# Patient Record
Sex: Female | Born: 1935 | Race: White | Hispanic: No | Marital: Married | Smoking: Former smoker
Health system: Southern US, Community
[De-identification: ages and names within clinical notes are randomized; demographics above are authoritative.]

## PROBLEM LIST (undated history)

## (undated) DIAGNOSIS — G250 Essential tremor: Secondary | ICD-10-CM

## (undated) DIAGNOSIS — J449 Chronic obstructive pulmonary disease, unspecified: Secondary | ICD-10-CM

## (undated) DIAGNOSIS — I480 Paroxysmal atrial fibrillation: Secondary | ICD-10-CM

## (undated) DIAGNOSIS — I4891 Unspecified atrial fibrillation: Secondary | ICD-10-CM

## (undated) DIAGNOSIS — R251 Tremor, unspecified: Secondary | ICD-10-CM

## (undated) DIAGNOSIS — J45909 Unspecified asthma, uncomplicated: Secondary | ICD-10-CM

## (undated) DIAGNOSIS — R413 Other amnesia: Secondary | ICD-10-CM

## (undated) DIAGNOSIS — I10 Essential (primary) hypertension: Secondary | ICD-10-CM

## (undated) HISTORY — DX: Tremor, unspecified: R25.1

## (undated) HISTORY — DX: Unspecified atrial fibrillation: I48.91

## (undated) HISTORY — DX: Other amnesia: R41.3

## (undated) HISTORY — PX: BREAST BIOPSY: SHX20

## (undated) HISTORY — DX: Paroxysmal atrial fibrillation: I48.0

## (undated) HISTORY — DX: Essential tremor: G25.0

## (undated) HISTORY — PX: OTHER SURGICAL HISTORY: SHX169

## (undated) HISTORY — DX: Essential (primary) hypertension: I10

---

## 2001-02-11 ENCOUNTER — Ambulatory Visit (HOSPITAL_COMMUNITY): Admission: RE | Admit: 2001-02-11 | Discharge: 2001-02-11 | Payer: Self-pay | Admitting: Obstetrics and Gynecology

## 2001-02-11 ENCOUNTER — Encounter: Payer: Self-pay | Admitting: Obstetrics and Gynecology

## 2002-03-20 ENCOUNTER — Ambulatory Visit (HOSPITAL_COMMUNITY): Admission: RE | Admit: 2002-03-20 | Discharge: 2002-03-20 | Payer: Self-pay | Admitting: Pulmonary Disease

## 2003-04-17 ENCOUNTER — Ambulatory Visit (HOSPITAL_COMMUNITY): Admission: RE | Admit: 2003-04-17 | Discharge: 2003-04-17 | Payer: Self-pay | Admitting: Pulmonary Disease

## 2003-11-07 ENCOUNTER — Ambulatory Visit (HOSPITAL_COMMUNITY): Admission: RE | Admit: 2003-11-07 | Discharge: 2003-11-07 | Payer: Self-pay | Admitting: Pulmonary Disease

## 2004-04-30 ENCOUNTER — Ambulatory Visit (HOSPITAL_COMMUNITY): Admission: RE | Admit: 2004-04-30 | Discharge: 2004-04-30 | Payer: Self-pay | Admitting: Pulmonary Disease

## 2005-04-09 ENCOUNTER — Ambulatory Visit: Payer: Self-pay | Admitting: Internal Medicine

## 2005-05-14 ENCOUNTER — Ambulatory Visit (HOSPITAL_COMMUNITY): Admission: RE | Admit: 2005-05-14 | Discharge: 2005-05-14 | Payer: Self-pay | Admitting: Pulmonary Disease

## 2005-07-23 ENCOUNTER — Ambulatory Visit (HOSPITAL_COMMUNITY): Admission: RE | Admit: 2005-07-23 | Discharge: 2005-07-23 | Payer: Self-pay | Admitting: Pulmonary Disease

## 2005-07-24 ENCOUNTER — Ambulatory Visit (HOSPITAL_COMMUNITY): Payer: Self-pay | Admitting: Pulmonary Disease

## 2005-07-24 ENCOUNTER — Encounter (HOSPITAL_COMMUNITY): Admission: RE | Admit: 2005-07-24 | Discharge: 2005-08-23 | Payer: Self-pay | Admitting: Pulmonary Disease

## 2005-08-03 ENCOUNTER — Ambulatory Visit (HOSPITAL_COMMUNITY): Admission: RE | Admit: 2005-08-03 | Discharge: 2005-08-03 | Payer: Self-pay | Admitting: Pulmonary Disease

## 2006-03-16 ENCOUNTER — Inpatient Hospital Stay (HOSPITAL_COMMUNITY): Admission: EM | Admit: 2006-03-16 | Discharge: 2006-03-18 | Payer: Self-pay | Admitting: Emergency Medicine

## 2006-04-06 ENCOUNTER — Encounter (HOSPITAL_COMMUNITY): Admission: RE | Admit: 2006-04-06 | Discharge: 2006-05-06 | Payer: Self-pay | Admitting: Pulmonary Disease

## 2006-04-23 ENCOUNTER — Ambulatory Visit (HOSPITAL_COMMUNITY): Admission: RE | Admit: 2006-04-23 | Discharge: 2006-04-23 | Payer: Self-pay | Admitting: Pulmonary Disease

## 2006-05-14 ENCOUNTER — Encounter (HOSPITAL_COMMUNITY): Admission: RE | Admit: 2006-05-14 | Discharge: 2006-06-13 | Payer: Self-pay | Admitting: Pulmonary Disease

## 2006-05-18 ENCOUNTER — Ambulatory Visit (HOSPITAL_COMMUNITY): Admission: RE | Admit: 2006-05-18 | Discharge: 2006-05-18 | Payer: Self-pay | Admitting: Pulmonary Disease

## 2006-06-14 ENCOUNTER — Encounter (HOSPITAL_COMMUNITY): Admission: RE | Admit: 2006-06-14 | Discharge: 2006-07-14 | Payer: Self-pay | Admitting: Pulmonary Disease

## 2007-03-24 ENCOUNTER — Ambulatory Visit (HOSPITAL_COMMUNITY): Admission: RE | Admit: 2007-03-24 | Discharge: 2007-03-24 | Payer: Self-pay | Admitting: Pulmonary Disease

## 2007-04-14 ENCOUNTER — Ambulatory Visit: Payer: Self-pay | Admitting: Orthopedic Surgery

## 2007-06-29 ENCOUNTER — Ambulatory Visit (HOSPITAL_COMMUNITY): Admission: RE | Admit: 2007-06-29 | Discharge: 2007-06-29 | Payer: Self-pay | Admitting: Pulmonary Disease

## 2008-07-05 ENCOUNTER — Ambulatory Visit (HOSPITAL_COMMUNITY): Admission: RE | Admit: 2008-07-05 | Discharge: 2008-07-05 | Payer: Self-pay | Admitting: Pulmonary Disease

## 2009-05-21 ENCOUNTER — Encounter: Payer: Self-pay | Admitting: Cardiology

## 2009-05-21 LAB — CONVERTED CEMR LAB
CO2: 21 meq/L
Chloride: 105 meq/L
Glucose, Bld: 76 mg/dL

## 2009-06-13 ENCOUNTER — Ambulatory Visit: Payer: Self-pay | Admitting: Cardiology

## 2009-06-13 ENCOUNTER — Encounter: Payer: Self-pay | Admitting: Cardiology

## 2009-06-13 ENCOUNTER — Inpatient Hospital Stay (HOSPITAL_COMMUNITY): Admission: EM | Admit: 2009-06-13 | Discharge: 2009-06-15 | Payer: Self-pay | Admitting: Emergency Medicine

## 2009-06-13 LAB — CONVERTED CEMR LAB: TSH: 3.322 microintl units/mL

## 2009-07-09 DIAGNOSIS — I5042 Chronic combined systolic (congestive) and diastolic (congestive) heart failure: Secondary | ICD-10-CM

## 2009-07-09 DIAGNOSIS — E785 Hyperlipidemia, unspecified: Secondary | ICD-10-CM

## 2009-07-09 DIAGNOSIS — I10 Essential (primary) hypertension: Secondary | ICD-10-CM | POA: Insufficient documentation

## 2009-07-09 DIAGNOSIS — I498 Other specified cardiac arrhythmias: Secondary | ICD-10-CM

## 2009-07-09 DIAGNOSIS — J4489 Other specified chronic obstructive pulmonary disease: Secondary | ICD-10-CM | POA: Insufficient documentation

## 2009-07-09 DIAGNOSIS — J449 Chronic obstructive pulmonary disease, unspecified: Secondary | ICD-10-CM

## 2009-07-10 ENCOUNTER — Ambulatory Visit: Payer: Self-pay | Admitting: Cardiology

## 2009-07-17 ENCOUNTER — Ambulatory Visit (HOSPITAL_COMMUNITY): Admission: RE | Admit: 2009-07-17 | Discharge: 2009-07-17 | Payer: Self-pay | Admitting: Pulmonary Disease

## 2009-07-18 ENCOUNTER — Encounter (INDEPENDENT_AMBULATORY_CARE_PROVIDER_SITE_OTHER): Payer: Self-pay

## 2009-07-18 LAB — CONVERTED CEMR LAB
ALT: 35 units/L
Albumin: 4.9 g/dL
Alkaline Phosphatase: 80 units/L
BUN: 14 mg/dL
CO2: 22 meq/L
Creatinine, Ser: 0.79 mg/dL
Glucose, Bld: 125 mg/dL
Total Protein: 7.1 g/dL

## 2009-09-09 ENCOUNTER — Encounter: Payer: Self-pay | Admitting: Adult Health

## 2009-09-09 ENCOUNTER — Ambulatory Visit: Payer: Self-pay | Admitting: Cardiology

## 2009-09-09 DIAGNOSIS — R002 Palpitations: Secondary | ICD-10-CM

## 2009-09-17 ENCOUNTER — Ambulatory Visit: Payer: Self-pay | Admitting: Cardiology

## 2009-10-23 ENCOUNTER — Ambulatory Visit: Payer: Self-pay | Admitting: Cardiology

## 2009-10-23 ENCOUNTER — Encounter (INDEPENDENT_AMBULATORY_CARE_PROVIDER_SITE_OTHER): Payer: Self-pay

## 2009-10-30 ENCOUNTER — Telehealth (INDEPENDENT_AMBULATORY_CARE_PROVIDER_SITE_OTHER): Payer: Self-pay | Admitting: *Deleted

## 2009-11-06 ENCOUNTER — Encounter: Payer: Self-pay | Admitting: Cardiology

## 2010-07-18 ENCOUNTER — Ambulatory Visit (HOSPITAL_COMMUNITY): Admission: RE | Admit: 2010-07-18 | Discharge: 2010-07-18 | Payer: Self-pay | Admitting: Pulmonary Disease

## 2010-11-18 NOTE — Progress Notes (Signed)
Summary: RX REFILL  Phone Note Call from Patient Call back at Home Phone [phone]   Caller: PT Reason for Call: Refill Medication Summary of Call: PT IS CALLING BECAUSE RX FOR DILTIAZEM WAS [facility]ED [facility] BUT THEY CALLED PATIENT TO LET HER KNOW IT NEEDED CALLED IN AGAIN WAS MISSING SOME INFORMATION. Initial call taken by: Faythe Ghee,  October 30, 2009 10:42 AM  Follow-up for Phone Call        [facility] Follow-up by: Teressa Lower RN,  October 31, 2009 11:40 AM

## 2010-11-18 NOTE — Miscellaneous (Signed)
Summary: CMP, Lipid's and TSH -07-18-09  Clinical Lists Changes  Observations: Added new observation of CALCIUM: 10.1 mg/dL (56/21/3086 57:84) Added new observation of ALBUMIN: 4.9 g/dL (69/62/9528 41:32) Added new observation of PROTEIN, TOT: 7.1 g/dL (44/10/270 53:66) Added new observation of SGPT (ALT): 35 units/L (07/18/2009 10:04) Added new observation of SGOT (AST): 30 units/L (07/18/2009 10:04) Added new observation of ALK PHOS: 80 units/L (07/18/2009 10:04) Added new observation of BILI DIRECT: Bili total: 0.3 mg/dL (44/12/4740 59:56) Added new observation of CREATININE: 0.79 mg/dL (38/75/6433 29:51) Added new observation of BUN: 14 mg/dL (88/41/6606 30:16) Added new observation of BG RANDOM: 125 mg/dL (10/27/3233 57:32) Added new observation of CO2 PLSM/SER: 22 meq/L (07/18/2009 10:04) Added new observation of CL SERUM: 107 meq/L (07/18/2009 10:04) Added new observation of K SERUM: 4.6 meq/L (07/18/2009 10:04) Added new observation of NA: 147 meq/L (07/18/2009 10:04) Added new observation of LDL: not calc mg/dL (20/25/4270 62:37) Added new observation of HDL: 59 mg/dL (62/83/1517 61:60) Added new observation of TRIGLYC TOT: 523 mg/dL (73/71/0626 94:85) Added new observation of CHOLESTEROL: 276 mg/dL (46/27/0350 09:38) Added new observation of TSH: 4.264 microintl units/mL (07/18/2009 10:04)

## 2010-11-18 NOTE — Assessment & Plan Note (Signed)
Summary: ROV   Visit Type:  Follow-up Primary Provider:  Dr.Edward Juanetta Gosling   History of Present Illness: [name] returns today for evaluation and management of her paroxysmal atrial fibrillation. On her last visit, her diltiazem was increased which has made her feel remarkably better with no further palpitations.  She denies orthopnea, PND, peripheral edema. She does not want to take Coumadin as outlined in previous notes.  Current Medications (verified): 1)  Singulair 10 Mg Tabs (Montelukast Sodium) .... Take 1 Tablet By Mouth Once A Day 2)  Symbicort 160-4.5 Mcg/act Aero (Budesonide-Formoterol Fumarate) .... Takes 1 Puff Two Times A Day 3)  Xopenex Concentrate 1.25 Mg/0.22ml Nebu (Levalbuterol Hcl) .... Take As Directed 4)  Xyzal 5 Mg Tabs (Levocetirizine Dihydrochloride) .... Take 1 Tab Daily 5)  Crestor 10 Mg Tabs (Rosuvastatin Calcium) .... Take 1 Tab Daily 6)  Zolpidem Tartrate 5 Mg Tabs (Zolpidem Tartrate) .... Take 1/2 Tab Qhs 7)  Fish Oil 1000 Mg Caps (Omega-3 Fatty Acids) .... Take 1 Cap Daily 8)  Diltiazem Hcl Er Beads 240 Mg Xr24h-Cap (Diltiazem Hcl Er Beads) .... Take 1 Tablet By Mouth Once Daily 9)  Benazepril-Hydrochlorothiazide 10-12.5 Mg Tabs (Benazepril-Hydrochlorothiazide) .... Take 1 Tab Daily 10)  Aspirin 81 Mg Tbec (Aspirin) .... Take One Tablet By Mouth Daily 11)  Womens Multivitamin Plus  Tabs (Multiple Vitamins-Minerals) .... Take 1 Tablet By Mouth Once A Day  Allergies (verified): 1)  ! Penicillin 2)  ! Codeine  Past History:  Past Medical History: Last updated: 07/09/2009 Current Problems:  UNSPEC COMBINED SYSTOLIC&DIASTOLIC HEART FAILURE (ICD-428.40) HYPERLIPIDEMIA, CONTROLLED (ICD-272.4) HYPERTENSION (ICD-401.9) COPD (ICD-496) ATRIAL ARRHYTHMIAS (ICD-427.89)  Past Surgical History: Last updated: 07/09/2009 no surgiacl history   Family History: Last updated: 07/09/2009 Father: Mother: Siblings:  Social History: Last updated:  07/09/2009 Retired  Married  Tobacco Use - Former.  Alcohol Use - no Regular Exercise - no Drug Use - no  Risk Factors: Exercise: no (07/09/2009)  Risk Factors: Smoking Status: quit (07/09/2009)  Review of Systems       negative other than history of present illness  Vital Signs:  Patient profile:   77 year old female Height:      63 inches Weight:      161 pounds O2 Sat:      96 % on Room air Pulse rate:   75 / minute BP sitting:   124 / 65  (left arm)  Vitals Entered By: Dreama Saa, CNA (October 23, 2009 12:56 PM)  O2 Flow:  Room air  Physical Exam  General:  Well developed, well nourished, in no acute distress. Head:  normocephalic and atraumatic Eyes:  PERRLA/EOM intact; conjunctiva and lids normal. Mouth:  Teeth, gums and palate normal. Oral mucosa normal. Neck:  Neck supple, no JVD. No masses, thyromegaly or abnormal cervical nodes. Lungs:  decreased breath sounds throughout Heart:  regular rate and rhythm, normal S1-S2 Msk:  Back normal, normal gait. Muscle strength and tone normal. Pulses:  pulses normal in all 4 extremities Extremities:  No clubbing or cyanosis. Neurologic:  resting tremor in both hands, alert oriented x3 Skin:  Intact without lesions or rashes. Psych:  Normal affect.   Impression & Recommendations:  Problem # 1:  ATRIAL ARRHYTHMIAS (ICD-427.89) Assessment Improved This was mostly atrial fibrillation when she was in the hospital. Increasing diltiazem has really helped alleviate her symptoms of palpitations et Karie Soda. She does not want to take Coumadin so remained with aspirin. She promises to let us know if  she has recurrent symptoms that are more than brief period Her updated medication list for this problem includes:    Diltiazem Hcl Er Beads 240 Mg Xr24h-cap (Diltiazem hcl er beads) .Marland Kitchen... Take 1 tablet by mouth once daily    Benazepril-hydrochlorothiazide 10-12.5 Mg Tabs (Benazepril-hydrochlorothiazide) .Marland Kitchen... Take 1 tab daily     Aspirin 81 Mg Tbec (Aspirin) .Marland Kitchen... Take one tablet by mouth daily  Her updated medication list for this problem includes:    Diltiazem Hcl Er Beads 240 Mg Xr24h-cap (Diltiazem hcl er beads) .Marland Kitchen... Take 1 tablet by mouth once daily    Benazepril-hydrochlorothiazide 10-12.5 Mg Tabs (Benazepril-hydrochlorothiazide) .Marland Kitchen... Take 1 tab daily    Aspirin 81 Mg Tbec (Aspirin) .Marland Kitchen... Take one tablet by mouth daily  Problem # 2:  PALPITATIONS (ICD-785.1) Assessment: Improved  Her updated medication list for this problem includes:    Diltiazem Hcl Er Beads 240 Mg Xr24h-cap (Diltiazem hcl er beads) .Marland Kitchen... Take 1 tablet by mouth once daily    Benazepril-hydrochlorothiazide 10-12.5 Mg Tabs (Benazepril-hydrochlorothiazide) .Marland Kitchen... Take 1 tab daily    Aspirin 81 Mg Tbec (Aspirin) .Marland Kitchen... Take one tablet by mouth daily  Her updated medication list for this problem includes:    Diltiazem Hcl Er Beads 240 Mg Xr24h-cap (Diltiazem hcl er beads) .Marland Kitchen... Take 1 tablet by mouth once daily    Benazepril-hydrochlorothiazide 10-12.5 Mg Tabs (Benazepril-hydrochlorothiazide) .Marland Kitchen... Take 1 tab daily    Aspirin 81 Mg Tbec (Aspirin) .Marland Kitchen... Take one tablet by mouth daily  Problem # 3:  HYPERTENSION (ICD-401.9) Assessment: Improved  Her updated medication list for this problem includes:    Diltiazem Hcl Er Beads 240 Mg Xr24h-cap (Diltiazem hcl er beads) .Marland Kitchen... Take 1 tablet by mouth once daily    Benazepril-hydrochlorothiazide 10-12.5 Mg Tabs (Benazepril-hydrochlorothiazide) .Marland Kitchen... Take 1 tab daily    Aspirin 81 Mg Tbec (Aspirin) .Marland Kitchen... Take one tablet by mouth daily  Her updated medication list for this problem includes:    Diltiazem Hcl Er Beads 240 Mg Xr24h-cap (Diltiazem hcl er beads) .Marland Kitchen... Take 1 tablet by mouth once daily    Benazepril-hydrochlorothiazide 10-12.5 Mg Tabs (Benazepril-hydrochlorothiazide) .Marland Kitchen... Take 1 tab daily    Aspirin 81 Mg Tbec (Aspirin) .Marland Kitchen... Take one tablet by mouth daily  Patient Instructions: 1)  Your  physician recommends that you schedule a follow-up appointment in: 1 year 2)  Your physician recommends that you continue on your current medications as directed. Please refer to the Current Medication list given to you today. Prescriptions: DILTIAZEM HCL ER BEADS 240 MG XR24H-CAP (DILTIAZEM HCL ER BEADS) Take 1 tablet by mouth once daily  #90 x 3   Entered by:   Larita Fife Via LPN   Authorized by:   Gaylord Shih, MD, [facility]   Signed by:   Larita Fife Via LPN on 16/07/9603   Method used:   Faxed to ...       [facility] education [facility])       [address] [address]       [place], [place]  [address]       Ph: [phone]       Fax: [phone]   RxID:   [id number]

## 2010-11-18 NOTE — Miscellaneous (Signed)
Summary: diltiazem refill  Clinical Lists Changes  Medications: Rx of DILTIAZEM HCL ER BEADS 240 MG XR24H-CAP (DILTIAZEM HCL ER BEADS) Take 1 tablet by mouth once daily;  #90 x 3;  Signed;  Entered by: Teressa Lower RN;  Authorized by: Gaylord Shih, MD, [facility];  Method used: Print then Give to Patient    Prescriptions: DILTIAZEM HCL ER BEADS 240 MG XR24H-CAP (DILTIAZEM HCL ER BEADS) Take 1 tablet by mouth once daily  #90 x 3   Entered by:   Teressa Lower RN   Authorized by:   Gaylord Shih, MD, [facility]   Signed by:   Teressa Lower RN on 11/06/2009   Method used:   Print then Give to Patient   RxID:   [id number]

## 2010-12-17 ENCOUNTER — Encounter: Payer: Self-pay | Admitting: *Deleted

## 2011-01-07 ENCOUNTER — Encounter: Payer: Self-pay | Admitting: Cardiology

## 2011-01-07 ENCOUNTER — Ambulatory Visit (INDEPENDENT_AMBULATORY_CARE_PROVIDER_SITE_OTHER): Payer: Medicare Other | Admitting: Cardiology

## 2011-01-07 VITALS — BP 107/59 | HR 66 | Ht 63.0 in | Wt 140.0 lb

## 2011-01-07 DIAGNOSIS — R002 Palpitations: Secondary | ICD-10-CM

## 2011-01-07 DIAGNOSIS — I4891 Unspecified atrial fibrillation: Secondary | ICD-10-CM

## 2011-01-07 DIAGNOSIS — I480 Paroxysmal atrial fibrillation: Secondary | ICD-10-CM

## 2011-01-07 NOTE — Progress Notes (Signed)
   Patient ID: [name], female    DOB: 1934-05-06, 77 y.o.   MRN: [id number]  HPI [name] comes in for evaluation and management of her PAF and history of palpitations. The only time she has palpitations is when she is upset. She remains an active walker and has no limitations. She still does want to take Coumadin. She has lost 30lbs. E.KG today shows NSR.    Review of Systems  All other systems reviewed and are negative.      Physical Exam  Constitutional: She is oriented to person, place, and time. She appears well-developed and well-nourished.  HENT:  Head: Normocephalic and atraumatic.  Eyes: Conjunctivae and EOM are normal. Pupils are equal, round, and reactive to light.  Neck: Normal range of motion. Neck supple. No tracheal deviation present. No thyromegaly present.  Cardiovascular: Normal rate, regular rhythm and intact distal pulses.   Murmur heard. Pulmonary/Chest: Effort normal and breath sounds normal.  Abdominal: Soft. Bowel sounds are normal.  Musculoskeletal: Normal range of motion. She exhibits no edema and no tenderness.  Neurological: She is alert and oriented to person, place, and time. She has normal reflexes.  Skin: Skin is warm and dry.  Psychiatric: She has a normal mood and affect. Her behavior is normal. Judgment and thought content normal.

## 2011-01-13 ENCOUNTER — Encounter: Payer: Self-pay | Admitting: Cardiology

## 2011-01-24 LAB — BASIC METABOLIC PANEL
BUN: 9 mg/dL (ref 6–23)
Calcium: 9.3 mg/dL (ref 8.4–10.5)
Chloride: 112 mEq/L (ref 96–112)
Creatinine, Ser: 0.8 mg/dL (ref 0.4–1.2)
GFR calc Af Amer: >60 mL/min (ref >60)
Glucose, Bld: 86 mg/dL (ref 70–99)
Sodium: 137 mEq/L (ref 135–145)

## 2011-01-24 LAB — HEPATIC FUNCTION PANEL
ALT: 20 U/L (ref 0–35)
Indirect Bilirubin: 0.4 mg/dL (ref 0.3–0.9)
Total Bilirubin: 0.5 mg/dL (ref 0.3–1.2)

## 2011-01-24 LAB — CBC
HCT: 42 % (ref 36.0–46.0)
Platelets: 259 10*3/uL (ref 150–400)
RDW: 13.4 % (ref 11.5–15.5)

## 2011-01-24 LAB — DIFFERENTIAL
Basophils Relative: 0 % (ref 0–1)
Eosinophils Absolute: 0.3 10*3/uL (ref 0.0–0.7)
Monocytes Absolute: 0.8 10*3/uL (ref 0.1–1.0)
Monocytes Relative: 12 % (ref 3–12)
Neutro Abs: 4.3 10*3/uL (ref 1.7–7.7)
Neutrophils Relative %: 61 % (ref 43–77)

## 2011-01-24 LAB — CARDIAC PANEL(CRET KIN+CKTOT+MB+TROPI)
CK, MB: 3.1 ng/mL (ref 0.3–4.0)
Relative Index: 1.9 (ref 0.0–2.5)
Relative Index: 2 (ref 0.0–2.5)
Troponin I: 0.01 ng/mL (ref 0.00–0.06)
Troponin I: 0.03 ng/mL (ref 0.00–0.06)

## 2011-01-24 LAB — POCT CARDIAC MARKERS: Myoglobin, poc: 85.2 ng/mL (ref 12–200)

## 2011-01-24 LAB — PROTIME-INR: INR: 0.9 (ref 0.00–1.49)

## 2011-03-03 NOTE — Assessment & Plan Note (Signed)
[facility]                       [place] CARDIOLOGY OFFICE NOTE   NAME:FERGUSONAmera, [name]                     MRN:          [id number]  DATE:07/10/2009                            DOB:          Sep 24, 1934    [name] comes in today for followup after being hospitalized with  atrial fibrillation with rapid ventricular rate.  Some of the notes in  the hospital characterized as an SVT but I remember looking at the  strips and it look mostly like atrial fib.   Her risk factors for atrial fib include age as well as COPD.   She had had some palpitations which she complained to Dr. Juanetta Gosling about  several days prior to coming into the hospital.  He had placed a monitor  which demonstrated those findings.   Her echocardiogram in the hospital shows some diastolic dysfunction,  otherwise unremarkable.   She has had no further atrial fib since discharge.  She did have some  post conversion fatigue.   Her current meds:  1. Singulair 4 mg pack 1 daily.  2. Symbicort 160/4.5.  3. Aerosol 1 puff twice a day.  4. Xopenex 1.25 mg solution nebulizer as directed.  5. Xyzal 5 mg per day.  6. Crestor 10 mg per day.  7. Azulfidine half a tablet nightly.  8. Fish oil 1000 mg daily.  9. Diltiazem extended release 180 mg per day.  10.Benazepril HCTZ 10/12-1/2 daily.  11.Aspirin 81 mg a day.   She also has a history of hypertension as I did note in the above  notation.   She looks remarkably good today.  She has somewhat of a raspy voice.   PHYSICAL EXAMINATION:  VITAL SIGNS:  Her blood pressure is 135/79, her  pulse is 79 and regular.  She is 63 inches, weighs 168 pounds.  BMI is  29.8.  No acute distress.  HEENT:  Essentially normal.  NECK:  Supple.  Carotid upstrokes are equal bilaterally without bruits.  No JVD.  Thyroid is not enlarged.  Trachea is midline.  LUNGS:  Clear without rhonchi or wheezes, but decreased breath sounds  throughout.  CARDIAC:   Nondisplaced PMI, normal S1 and S2.  No gallop, rub, or  murmur.  ABDOMEN:  Soft, good bowel sounds, no obvious tenderness, or  organomegaly.  EXTREMITIES:  No edema.  Pulses are present.  NEURO:  Intact.   Electrocardiogram today shows sinus rhythm with nonspecific T-wave  changes otherwise normal.  Specifically, PR, QRS, QTC intervals are  normal.   ASSESSMENT AND PLAN:  [name] is doing well.  Her risk factors for  recurrent atrial fib include age, COPD particularly if there is an  exacerbations, as I explained to her and her husband today, and history  of hypertension.  She does have some minimal diastolic dysfunction on  her echocardiogram.  At this time, we will continue with aspirin and  diltiazem.  If she begins to have recurrent episodes of atrial fib,  which were characterized by palpitations and feeling extremely weak on  the morning of admission, she will let us know.  If she stays  in atrial  fib for more than 24 hours, she will need to be evaluated.  Hopefully,  we can avoid Coumadin which she really does not want to take.     Thomas C. Daleen Squibb, MD, [facility]  Electronically Signed    TCW/MedQ  DD: 07/10/2009  DT: 07/11/2009  Job #: [id number]

## 2011-03-03 NOTE — Group Therapy Note (Signed)
NAMECOLLENE, MASSIMINO              ACCOUNT NO.:  [id number]   MEDICAL RECORD NO.:  [id number]          PATIENT TYPE:  INP   LOCATION:  IC03                          FACILITY:  [facility]   PHYSICIAN:  Edward L. Juanetta Gosling, M.D.DATE OF BIRTH:  02/06/1934   DATE OF PROCEDURE:  DATE OF DISCHARGE:                                 PROGRESS NOTE   [name] was admitted yesterday with rapid heart rate.  It was SVT,  some atrial fibrillation.  She is now in sinus rhythm with a rate of  about 60 on diltiazem at 5 mg.  She says that everything else is going  okay.  She has no other new complaints.   PHYSICAL EXAMINATION:  This morning on shows she is awake and alert.  Chest is clear.  Heart is regular rate of about 60, blood pressure  110/70 and she looks very comfortable.   Cardiology consult is noted and appreciated.   LABORATORY WORK:  This morning, her electrolytes were normal and her TSH  was also normal.  Echocardiogram done yesterday shows an ejection  fraction of 60-65% with normal wall motion and some diastolic  dysfunction.  Right ventricle was normal and overall this is okay.   ASSESSMENT:  She is better.   PLAN:  To give her oral Cardizem and then depending on how she does, she  may be able to be discharged either later today or in the morning.  We  probably want to see how she does on oral medications for a 24-hour  period.      Edward L. Juanetta Gosling, M.D.  Electronically Signed     ELH/MEDQ  D:  06/14/2009  T:  06/14/2009  Job:  [id number]

## 2011-03-03 NOTE — Consult Note (Signed)
[name], [name]              ACCOUNT NO.:  [id number]   MEDICAL RECORD NO.:  [id number]          PATIENT TYPE:  INP   LOCATION:  IC03                          [facility]  [facility]   PHYSICIAN:  Thomas C. Wall, MD, FACCDATE OF BIRTH:  May 18, 1934   DATE OF CONSULTATION:  06/13/2009  DATE OF DISCHARGE:                                 CONSULTATION   PRIMARY CARDIOLOGIST:  Maisie Fus C. Daleen Squibb, MD, [facility]   PRIMARY CARE PHYSICIAN:  Edward L. Juanetta Gosling, MD   REASON FOR CONSULTATION:  Rapid SVT per CardioNet report.   HISTORY OF PRESENT ILLNESS:  A 77 year old Caucasian female with no  prior history of coronary disease but had a history of COPD,  hypertension, and hypercholesterolemia, had recently been seen by her  primary care physician, Dr. Juanetta Gosling, on May 21, 2009, secondary to  fluttering in her chest, which she mentioned during a routine office  visit.  As a result of this, she was referred to our office to have a  CardioNet placed.  CardioNet was read this morning by Dr. Nona Dell in the office and it was noted that the patient was having  episodes of SVT and atrial fibrillation with rate up to 120-140 beats  per minute.  As a result of this, the patient's primary care physician  was called by our office and she was advised to come to the emergency  room.  Upon arrival in the emergency room, the patient's heart rate was  160 beats per minute.  She denied any chest pain or shortness of breath.  She had complained of some mild dizziness associated with the rapid  heart rate.  She states that she had not been feeling the rapid heart  rate prior to 2-3 weeks ago and never had had a cardiac workup before or  any other cardiac etiology.  As a result, the patient was admitted to  ICU, was placed on a Cardizem drip of 5 mg after a 20 mg bolus and has  currently converted to normal sinus rhythm approximately 1 hour after  admission.  The patient's heart rate is now in the 60s.   REVIEW OF  SYSTEMS:  Positive for palpitations and dizziness, negative  for chest pain and shortness of breath although she does have chronic  asthma and wheezing symptoms but has not had to use her inhaler.  All  other systems have been reviewed and found to be negative other than  those mentioned above.   PAST MEDICAL HISTORY:  Right lower lobe pneumonia, recognition in 2007;  history of COPD and asthma for approximately 10 years; Legionnaires  disease in 2010; hypercholesterolemia for 2-3 years; and hypertension  for 5-6 years.   PAST SURGICAL HISTORY:  Left breast biopsy, which was found to be  negative.   SOCIAL HISTORY:  Lives in [place] with her husband who is retired  from [employer].  She is a 30-pack-year smoker who smoked for 20  years, negative for EtOH, negative for drug use.   FAMILY HISTORY:  Asthma and diabetes.   CURRENT MEDICATIONS:  1. Aspirin 81 mg daily.  2. Cardizem 5 mg drip.  3. Benazepril 10/12.5 mg daily.  4. Singulair 5 mg at bedtime.  5. Crestor 10 mg at bedtime.  6. Zolpidem 5 mg at bedtime p.r.n.  7. Low-molecular-weight heparin.   ALLERGIES:  PENICILLIN, CODEINE, and sometimes PAINT.   CURRENT LABORATORY DATA:  Hemoglobin 14.4, hematocrit 42.0, white blood  cells 7.1, and platelets 259.  Sodium 137, potassium 4.1, chloride 105,  CO2 of 26, BUN 16, creatinine 0.6, glucose 116, total bili 0.5, alkaline  phosphatase 83, AST 28, ALT 20, total protein 6.6, albumin 4.0, initial  troponin point of care 0.05, PT 4.1, and INR 0.9.  A chest x-ray  revealing COPD with no acute findings.  EKG revealing SVT at a rate of  160 beats per minute.  Cardiovascular risk factors; hypertension,  cholesterol, and age.   PHYSICAL EXAMINATION:  VITAL SIGNS:  Blood pressure 123/40, heart rate  70, respirations 18, and O2 sat 100% on 2 L.  GENERAL:  She is awake, alert, and oriented; in no acute distress.  Affect is pleasant and she responds appropriately.  HEENT:  Head  is normocephalic and atraumatic.  Eyes, PERRLA.  The  patient wears glasses.  Mucous membranes of mouth are pink and moist.  NECK:  Supple without JVD or carotid bruits appreciated.  CARDIOVASCULAR:  Regular rate and rhythm with a soft systolic murmur  noted at the left sternal border.  LUNGS:  Clear to auscultation with mild wheezes bilaterally in the  bases.  SKIN:  Warm and dry.  ABDOMEN:  Soft and nontender with 2+ bowel sounds.  EXTREMITIES:  Without clubbing, cyanosis, or edema.  NEURO:  Cranial nerves II through XII are grossly intact.   IMPRESSION:  1. Atypical rapid ventricular response and supraventricular      tachycardia, now converted to normal sinus rhythm on Cardizem drip      at 5 mg.  We will check echocardiogram.  We will take the p.o.      Cardizem in a.m. and consider outpatient stress Myoview.  We will      await TSH result.  Her [place] score currently is 1.  2. Chronic obstructive pulmonary disease with asthma per primary,      continue her medications; inhalers at his discretion.  3. Hypertension, currently controlled on current medications.   PLAN:  This is a 77 year old Caucasian female with no prior cardiac  history who is admitted secondary to abnormal CardioNet monitoring  report revealing SVT and atrial fibrillation with RVR.  The patient is  currently in normal sinus rhythm on Cardizem drip at 5 mg.  The  patient's [place] score is 1.  We will do an echocardiogram for LV  function.  Continue to cycle enzymes.  Consider outpatient stress  Myoview or cath depending on the results of cardiac enzymes and echo  report.  At this time, the patient is stable.  We will make further  recommendations throughout hospital course depending on the patient's  response.   On behalf of the physicians and providers of [facility], we would like to thank Dr. Juanetta Gosling for allowing [place] to  participate in the care of this patient.      Bettey Mare. Lyman Bishop,  NP      Jesse Sans. Daleen Squibb, MD, [facility]  Electronically Signed    KML/MEDQ  D:  06/13/2009  T:  06/13/2009  Job:  [id number]   cc:   Ramon Dredge L. Juanetta Gosling, M.D.  Fax: [phone]

## 2011-03-03 NOTE — Discharge Summary (Signed)
NAMEBERDELLA, Bowman              ACCOUNT NO.:  [id number]   MEDICAL RECORD NO.:  [id number]          PATIENT TYPE:  INP   LOCATION:  A335                          FACILITY:  [facility]   PHYSICIAN:  Edward L. Juanetta Gosling, M.D.DATE OF BIRTH:  1934-05-11   DATE OF ADMISSION:  06/13/2009  DATE OF DISCHARGE:  08/28/2010LH                               DISCHARGE SUMMARY   FINAL DISCHARGE DIAGNOSES:  1. Atrial arrhythmias with rapid ventricular response.  2. Chronic obstructive pulmonary disease.  3. Hypertension.  4. Hyperlipidemia.  5. Diastolic cardiac dysfunction.   HISTORY:  [name] is a 77 year old who came to the hospital because  of tachycardia.  She had been in her usual state of fair health at home  with some significant problems with COPD.  She had been in my office  several weeks ago complaining that she was having palpitations.  She was  set up to have an event monitor and while her monitor was on, she had a  heart rate up to about 190.  She was sent to the emergency room at that  point.  When she came to the emergency room, she was dizzy but did not  have any chest pain or any other symptoms.   PHYSICAL EXAMINATION:  GENERAL:  She is awake and alert.  VITAL SIGNS:  Heart rate was in the 190s initially then down to about  80, blood pressure was in the 120s.  CHEST:  Clear without wheezes.  HEART:  Regular without gallop.  ABDOMEN:  Soft.   LABORATORY WORK:  Essentially normal.  TSH was normal.  She had an  echocardiogram that showed some diastolic dysfunction.  She was started  on Cardizem intravenously and had excellent control.  She was placed  then on oral Cardizem and still had excellent control.  By the time of  discharge she was much improved.  She is going to be on Cardizem CD 180  daily.  She will continue with her other medications.  She will stop  benazepril and hydrochlorothiazide 10/25 but continue;  1. Singulair 5 mg daily.  2. Symbicort 164.5 two times a  day.  3. Xopenex inhaler as needed.  4. Xyzal 5 mg daily.  5. Crestor 10 mg daily.  6. Zolpidem, she takes one half of a 5 mg tablet.   She will have Cardiology followup and follow up in my office in about 10  days.  She will be on Cardizem CD 180.      Edward L. Juanetta Gosling, M.D.  Electronically Signed     ELH/MEDQ  D:  06/15/2009  T:  06/15/2009  Job:  [id number]

## 2011-03-03 NOTE — H&P (Signed)
Bowman, Donna              ACCOUNT NO.:  [id number]   MEDICAL RECORD NO.:  [id number]          PATIENT TYPE:  INP   LOCATION:  IC03                          FACILITY:  [facility]   PHYSICIAN:  Edward L. Juanetta Gosling, M.D.DATE OF BIRTH:  [birth year]   DATE OF ADMISSION:  06/13/2009  DATE OF DISCHARGE:  LH                              HISTORY & PHYSICAL   HISTORY OF PRESENT ILLNESS:  [name] is a 77 year old who was  admitted to the hospital because of tachycardia.  She had been in her  usual state of fair health at home.  She does have significant COPD and  she had been in my office several weeks ago complaining that she was  having episodes of palpitations.  She was set up with an event monitor  and when she was having the event monitor, her heart rate went up to  about 190 and the monitoring company called my office and we had her  sent to the emergency room because of the rapid heartbeat.  She was not  terribly symptomatic, but did have some dizziness.  She denied any chest  pain.  She has a history of COPD and asthma.  She has hyperlipidemia and  hypertension.   SOCIAL HISTORY:  She lives at home with her husband.  She does not use  any alcohol.  She does not use any illicit drugs.  She is a nonsmoker  for about 20+ years, but does have some smoking history.   FAMILY HISTORY:  Not positive for any sort of cardiac disease that she  is aware of, but it is positive for asthma and diabetes.   REVIEW OF SYSTEMS:  Other than as mentioned is essentially negative.   PHYSICAL EXAMINATION:  VITAL SIGNS:  Her exam when she came to the  emergency room is that her pulse rate was at the 190 sinus tach.  Blood  pressure in the 120s.  Her respirations 16.  She is afebrile.  HEENT:  Pupils are reactive.  Nose and throat are clear.  Mucous  membranes are moist.  NECK:  Supple without masses.  She does not have any bruits or jugular  venous distention.  CHEST:  Some rhonchi bilaterally, but  not a great deal.  HEART:  Regular without gallop.  ABDOMEN:  Soft without masses.  EXTREMITIES:  No edema.   LABORATORY DATA:  Lab work so far is normal.  She has a TSH pending.  She will need an echocardiogram and I have asked for cardiology  consultation.      Edward L. Juanetta Gosling, M.D.  Electronically Signed     ELH/MEDQ  D:  06/13/2009  T:  06/14/2009  Job:  [id number]

## 2011-03-03 NOTE — Group Therapy Note (Signed)
NAMEDESIRAY, ORCHARD              ACCOUNT NO.:  [id number]   MEDICAL RECORD NO.:  [id number]          PATIENT TYPE:  INP   LOCATION:  A335                          FACILITY:  [facility]   PHYSICIAN:  Edward L. Juanetta Gosling, M.D.DATE OF BIRTH:  20-Aug-1934   DATE OF PROCEDURE:  DATE OF DISCHARGE:                                 PROGRESS NOTE   Ms. Kegel is overall I think, doing much better.  She has no new  complaints.  She has not had any rapid heart beats.  She wants to be  discharged and I think that is appropriate.   PHYSICAL EXAMINATION:  VITAL SIGNS:  This morning shows that her  temperature is 97.8, pulse 82, respirations 20, blood pressure 141/75,  and her O2 sat is 98% on room air.  GENERAL:  She is in sinus rhythm on monitor.  CHEST:  Clear.  HEART:  Regular.  ABDOMEN:  Soft.   ASSESSMENT:  She is doing better.   PLAN:  To discharge.  Please see discharge summary for details.      Edward L. Juanetta Gosling, M.D.  Electronically Signed     ELH/MEDQ  D:  06/15/2009  T:  06/15/2009  Job:  [id number]

## 2011-03-06 NOTE — Discharge Summary (Signed)
NAMEMONNIE, GUDGEL              ACCOUNT NO.:  [id number]   MEDICAL RECORD NO.:  [id number]          PATIENT TYPE:  INP   LOCATION:  A310                          FACILITY:  [facility]   PHYSICIAN:  Edward L. Juanetta Gosling, M.D.DATE OF BIRTH:  June 24, 1934   DATE OF ADMISSION:  03/16/2006  DATE OF DISCHARGE:  05/31/2007LH                                 DISCHARGE SUMMARY   DISCHARGE DIAGNOSES:  1.  Right lower lobe pneumonia.  2.  Chronic obstructive pulmonary disease.  3.  Asthma.   HISTORY OF PRESENT ILLNESS:  Ms. Ishii is a 77 year old who presented  with fever, cough, congestion and chills.  This had been started about 3  hours prior to her admission.  She had been feeling great the day before and  when she came to the emergency room, she was noted to have a pneumonia by  chest x-ray.  She had some back pain.  O2 saturation was low.  She has had  COPD and asthma and previous pneumonia that had been treated as an  outpatient.   PHYSICAL EXAMINATION:  VITAL SIGNS:  Temperature 101.3, pulse 122,  respirations 22, O2 was 91% on room air.  GENERAL:  Her pain initially was rated 9/10 on her back.  CHEST:  Her chest showed rhonchi on the right, more than on the left.   The rest of her exam was pretty much unremarkable.   LABORATORY DATA AND X-RAY FINDINGS:  White blood count was 13,200, platelets  298, 92% neutrophils.  ABG with pO2 was 59, pCO2 32, pH 7.48, this on room  air.   Chest x-ray showed a new right lower lobe pneumonia.   HOSPITAL COURSE:  She was started on Avelox, intravenous steroids and  improved greatly.  By the time of discharge, she was much improved and was  discharged home.   DISCHARGE MEDICATIONS:  1.  Avelox 400 mg daily.  2.  Codiclear DH 5 mL q.4h. p.r.n. cough.  3.  Prednisone 40 mg tapering to 0 over 8 days.   FOLLOW UP:  She is going to follow up in my office in about 1 week.      Edward L. Juanetta Gosling, M.D.  Electronically Signed     ELH/MEDQ  D:   03/18/2006  T:  03/19/2006  Job:  [id number]

## 2011-03-06 NOTE — Group Therapy Note (Signed)
NAMEARIJANA, NARAYAN              ACCOUNT NO.:  [id number]   MEDICAL RECORD NO.:  [id number]          PATIENT TYPE:  INP   LOCATION:  A310                          FACILITY:  [facility]   PHYSICIAN:  Edward L. Juanetta Gosling, M.D.DATE OF BIRTH:  [birth year]   DATE OF PROCEDURE:  03/17/2006  DATE OF DISCHARGE:                                   PROGRESS NOTE   SUBJECTIVE:  Ms. Gerard is much improved this morning.  She says she feels  pretty much normal again.  She has no other new complaints.   OBJECTIVE:  Her physical examination today shows her temperature is 97.9,  pulse 77, respirations 16, blood pressure 108/62, O2 saturation is 95% on 2  liters.  She does not appear to be dyspneic.  Her chest is much clearer than  yesterday.  Her heart is regular.  Her abdomen soft.  She does not have any  edema.   ASSESSMENT:  She is much improved.   PLAN:  I am hopeful as much better she is now that we may be able to be  discharge tomorrow.  I do not plan any changes in her treatments today.      Edward L. Juanetta Gosling, M.D.  Electronically Signed     ELH/MEDQ  D:  03/17/2006  T:  03/17/2006  Job:  [id number]

## 2011-03-06 NOTE — Discharge Summary (Signed)
[name], [name]              ACCOUNT NO.:  [id number]   MEDICAL RECORD NO.:  [id number]          PATIENT TYPE:  INP   LOCATION:  A310                          FACILITY:  [facility]   PHYSICIAN:  Edward L. Juanetta Gosling, M.D.DATE OF BIRTH:  December 23, 1933   DATE OF ADMISSION:  03/16/2006  DATE OF DISCHARGE:  05/31/2007LH                                 DISCHARGE SUMMARY   [name] is overall about the same.  She says she feels great and wants  to go home.   PHYSICAL EXAMINATION:  VITAL SIGNS:  She has not had any more fever.  LUNGS:  Her chest is quite clear.  HEART:  Her heart is regular.  ABDOMEN:  Her abdomen is soft.  EXTREMITIES:  Showed no edema.  CENTRAL NERVOUS SYSTEM:  Grossly intact.   ASSESSMENT:  She is doing well.   PLAN:  I do plan to send her home.  Please see discharge summary for  details.      Edward L. Juanetta Gosling, M.D.  Electronically Signed     ELH/MEDQ  D:  03/18/2006  T:  03/18/2006  Job:  [id number]

## 2011-03-06 NOTE — H&P (Signed)
[name], HARNDEN              ACCOUNT NO.:  [id number]   MEDICAL RECORD NO.:  [id number]          PATIENT TYPE:  INP   LOCATION:  A310                          FACILITY:  [facility]   PHYSICIAN:  Edward L. Juanetta Gosling, M.D.DATE OF BIRTH:  December 13, 1933   DATE OF ADMISSION:  03/16/2006  DATE OF DISCHARGE:  LH                                HISTORY & PHYSICAL   PROBLEM:  Pneumonia.   SUBJECTIVE:  Ms. Shiffman is a 77 year old who was in her usual state of  fairly good health this morning when she started having chills and fever.  She had back pain.  She became more short of breath.  She was coughing and  because of all that she came to the emergency room.  When she was seen in  the emergency room she was dyspneic.  Her O2  saturation was low and she  just did not seem to be feeling well.  Chest x-ray showed what appears to be  a pneumonia.   PAST MEDICAL HISTORY:  Positive for COPD and asthma.  She had a previous  pneumonia that was treated as an outpatient.   SOCIAL HISTORY:  She lives at home with her husband.  Her husband has had  several health problems in last few years as well.  She is a nonsmoker.  Does not drink any alcohol.   FAMILY HISTORY:  Positive for asthma and diabetes.   REVIEW OF SYSTEMS:  Other than as mentioned, she says she has not really  been coughing up any sputum but all this has happened very suddenly.  She  has not had any chest pain.  She has back pain.  No CNS symptoms.  She  thinks she had a fever.   PHYSICAL EXAMINATION:  GENERAL:  She looks fairly comfortable.  VITAL SIGNS:  Temperature is 101.3 orally, blood pressure 113/65, pulse 122,  respirations 22, O2 saturation 91% on room air.  Pain rated a 9/10 in her  back.  HEENT:  Her pupils are equal, round, react to light and accommodation.  Nose  and throat are clear.  NECK:  Supple.  No masses are felt.  CHEST:  Shows some rhonchi on the right more than on the left.  HEART:  Regular without murmur,  gallop, or rub.  ABDOMEN:  Soft.  No masses felt.  EXTREMITIES:  No edema.  CNS:  Grossly intact.   LABORATORY WORK:  White count is 13,200, hemoglobin 14.1, platelets 298, 92%  neutrophils.  Electrolytes are normal.  Blood gas shows a pO2 of 59, pCO2 of  32, pH 7.48 on room air.  Chest x-ray shows a new right lower lobe  pneumonia.  Urine is pending.   PLAN:  Go ahead and started her on antibiotics.  She has had blood cultures  done.      Edward L. Juanetta Gosling, M.D.  Electronically Signed     ELH/MEDQ  D:  03/16/2006  T:  03/16/2006  Job:  [id number]

## 2011-06-15 ENCOUNTER — Other Ambulatory Visit (HOSPITAL_COMMUNITY): Payer: Self-pay | Admitting: Pulmonary Disease

## 2011-06-15 DIAGNOSIS — Z139 Encounter for screening, unspecified: Secondary | ICD-10-CM

## 2011-07-23 ENCOUNTER — Ambulatory Visit (HOSPITAL_COMMUNITY)
Admission: RE | Admit: 2011-07-23 | Discharge: 2011-07-23 | Disposition: A | Discharge status: Home / Self Care | Payer: Medicare Other | Source: Ambulatory Visit | Attending: Pulmonary Disease | Admitting: Pulmonary Disease

## 2011-07-23 DIAGNOSIS — Z139 Encounter for screening, unspecified: Secondary | ICD-10-CM

## 2011-07-23 DIAGNOSIS — Z1231 Encounter for screening mammogram for malignant neoplasm of breast: Secondary | ICD-10-CM | POA: Insufficient documentation

## 2011-10-22 ENCOUNTER — Other Ambulatory Visit: Payer: Self-pay

## 2011-10-22 ENCOUNTER — Emergency Department (HOSPITAL_COMMUNITY)
Admission: EM | Admit: 2011-10-22 | Discharge: 2011-10-22 | Disposition: A | Discharge status: Home / Self Care | Payer: Medicare Other | Attending: Emergency Medicine | Admitting: Emergency Medicine

## 2011-10-22 ENCOUNTER — Encounter (HOSPITAL_COMMUNITY): Payer: Self-pay

## 2011-10-22 ENCOUNTER — Emergency Department (HOSPITAL_COMMUNITY): Payer: Medicare Other

## 2011-10-22 DIAGNOSIS — IMO0002 Reserved for concepts with insufficient information to code with codable children: Secondary | ICD-10-CM

## 2011-10-22 DIAGNOSIS — Z87891 Personal history of nicotine dependence: Secondary | ICD-10-CM | POA: Insufficient documentation

## 2011-10-22 DIAGNOSIS — Z7982 Long term (current) use of aspirin: Secondary | ICD-10-CM | POA: Diagnosis not present

## 2011-10-22 DIAGNOSIS — R918 Other nonspecific abnormal finding of lung field: Secondary | ICD-10-CM | POA: Diagnosis not present

## 2011-10-22 DIAGNOSIS — I4891 Unspecified atrial fibrillation: Secondary | ICD-10-CM | POA: Diagnosis not present

## 2011-10-22 DIAGNOSIS — R000 Tachycardia, unspecified: Secondary | ICD-10-CM | POA: Diagnosis not present

## 2011-10-22 DIAGNOSIS — I10 Essential (primary) hypertension: Secondary | ICD-10-CM | POA: Insufficient documentation

## 2011-10-22 DIAGNOSIS — J984 Other disorders of lung: Secondary | ICD-10-CM | POA: Diagnosis not present

## 2011-10-22 DIAGNOSIS — R002 Palpitations: Secondary | ICD-10-CM | POA: Diagnosis not present

## 2011-10-22 DIAGNOSIS — I498 Other specified cardiac arrhythmias: Secondary | ICD-10-CM | POA: Diagnosis not present

## 2011-10-22 HISTORY — DX: Essential (primary) hypertension: I10

## 2011-10-22 LAB — CBC
HCT: 37.1 % (ref 36.0–46.0)
MCH: 30.3 pg (ref 26.0–34.0)
MCHC: 34 g/dL (ref 30.0–36.0)
MCV: 89.2 fL (ref 78.0–100.0)
Platelets: 316 10*3/uL (ref 150–400)
RDW: 12.9 % (ref 11.5–15.5)
WBC: 7.2 10*3/uL (ref 4.0–10.5)

## 2011-10-22 LAB — DIFFERENTIAL
Basophils Absolute: 0.1 10*3/uL (ref 0.0–0.1)
Eosinophils Absolute: 0.2 10*3/uL (ref 0.0–0.7)
Lymphs Abs: 2.1 10*3/uL (ref 0.7–4.0)
Monocytes Absolute: 0.6 10*3/uL (ref 0.1–1.0)
Neutro Abs: 4.2 10*3/uL (ref 1.7–7.7)

## 2011-10-22 LAB — COMPREHENSIVE METABOLIC PANEL
ALT: 16 U/L (ref 0–35)
AST: 21 U/L (ref 0–37)
Calcium: 10.2 mg/dL (ref 8.4–10.5)
Creatinine, Ser: 0.82 mg/dL (ref 0.50–1.10)
GFR calc non Af Amer: 68 mL/min — ABNORMAL LOW (ref >90)
Sodium: 137 mEq/L (ref 135–145)
Total Protein: 6.8 g/dL (ref 6.0–8.3)

## 2011-10-22 LAB — CARDIAC PANEL(CRET KIN+CKTOT+MB+TROPI)
CK, MB: 2.7 ng/mL (ref 0.3–4.0)
Relative Index: 2.4 (ref 0.0–2.5)
Troponin I: <0.3 ng/mL (ref <0.30)

## 2011-10-22 MED ORDER — DILTIAZEM HCL 50 MG/10ML IV SOLN
10.0000 mg | Freq: Once | INTRAVENOUS | Status: AC
  Administered 2011-10-22: 10 mg via INTRAVENOUS
  Filled 2011-10-22: qty 10

## 2011-10-22 MED ORDER — SODIUM CHLORIDE 0.9 % IV SOLN: Freq: Once | INTRAVENOUS | Status: DC

## 2011-10-22 NOTE — ED Notes (Signed)
Pt c/o "heart racing" since 0900 this morning. Pt states she checked her heart rate and it was 178 at home. Pt placed on Cardiac monitor and reading 190. Pt bear down as if having bm and now Hr 128

## 2011-10-22 NOTE — ED Notes (Signed)
Sinus rhythm. Resting quietly, no distress,

## 2011-10-22 NOTE — ED Notes (Signed)
Heart rate 100-115.  No chest pain.  Alert,  Husband at bedside.  Skin warm and dry.

## 2011-10-22 NOTE — ED Provider Notes (Signed)
History   This chart was scribed for Benny Lennert, MD by Clarita Crane. The patient was seen in room APA11/APA11 and the patient's care was started at 12:40PM.   CSN: [id number]  Arrival date & time 10/22/11  1221   First MD Initiated Contact with Patient 10/22/11 1236      Chief Complaint  Patient presents with  . Tachycardia    (Consider location/radiation/quality/duration/timing/severity/associated sxs/prior treatment) HPI [name] is a 78 y.o. female who presents to the Emergency Department complaining of constant moderate to severe palpitations onset this morning and persistent since with no associated symptoms. Denies chest pain, abdominal pain, HA. Patient reports she has experienced palpitations previously but episodes were not as severe. Patient with h/o SVT and HTN.   Past Medical History  Diagnosis Date  . SVT (supraventricular tachycardia)   . Hypertension     Past Surgical History  Procedure Date  . Breast biopsy     No family history on file.  History  Substance Use Topics  . Smoking status: Former Smoker -- 1.0 packs/day    Types: Cigarettes    Quit date: 01/07/1991  . Smokeless tobacco: Never Used  . Alcohol Use: No    OB History    Grav Para Term Preterm Abortions TAB SAB Ect Mult Living                  Review of Systems  Constitutional: Negative for fatigue.  HENT: Negative for congestion, sinus pressure and ear discharge.   Eyes: Negative for discharge.  Respiratory: Negative for cough.   Cardiovascular: Positive for palpitations. Negative for chest pain.  Gastrointestinal: Negative for abdominal pain and diarrhea.  Genitourinary: Negative for frequency and hematuria.  Musculoskeletal: Negative for back pain.  Skin: Negative for rash.  Neurological: Negative for seizures and headaches.  Hematological: Negative.   Psychiatric/Behavioral: Negative for hallucinations.    Allergies  Codeine and Penicillins  Home Medications    Current Outpatient Rx  Name Route Sig Dispense Refill  . ASPIRIN 81 MG PO TABS Oral Take 81 mg by mouth daily.      Marland Kitchen BENAZEPRIL-HYDROCHLOROTHIAZIDE 10-12.5 MG PO TABS Oral Take 1 tablet by mouth daily.      . BUDESONIDE-FORMOTEROL FUMARATE 160-4.5 MCG/ACT IN AERO Inhalation Inhale 2 puffs into the lungs 2 (two) times daily.      Marland Kitchen DILTIAZEM HCL ER COATED BEADS 240 MG PO CP24 Oral Take 240 mg by mouth daily.      . OMEGA-3 FATTY ACIDS 1000 MG PO CAPS Oral Take 1 g by mouth daily.      Marland Kitchen LEVOCETIRIZINE DIHYDROCHLORIDE 5 MG PO TABS Oral Take 5 mg by mouth daily.      Marland Kitchen MONTELUKAST SODIUM 10 MG PO TABS Oral Take 10 mg by mouth at bedtime.      . MULTI-VITAMIN/MINERALS PO TABS Oral Take 1 tablet by mouth daily.      Marland Kitchen ROSUVASTATIN CALCIUM 10 MG PO TABS Oral Take 10 mg by mouth daily.      . TOPIRAMATE 25 MG PO TABS Oral Take 25 mg by mouth 2 (two) times daily.      Marland Kitchen ZOLPIDEM TARTRATE 5 MG PO TABS Oral Take 2.5 mg by mouth at bedtime as needed. sleep      BP 124/76  Pulse 82  Resp 20  SpO2 97%  Physical Exam  Nursing note and vitals reviewed. Constitutional: She is oriented to person, place, and time. She appears well-developed and well-nourished.  No distress.  HENT:  Head: Normocephalic and atraumatic.  Eyes: EOM are normal. Pupils are equal, round, and reactive to light.  Neck: Neck supple. No tracheal deviation present.  Cardiovascular: An irregular rhythm present. Tachycardia present.  Exam reveals no gallop and no friction rub.   No murmur heard. Pulmonary/Chest: Effort normal. No respiratory distress. She has no wheezes. She has no rales.  Abdominal: Soft. She exhibits no distension.  Musculoskeletal: Normal range of motion. She exhibits no edema.  Neurological: She is alert and oriented to person, place, and time. No sensory deficit.  Skin: Skin is warm and dry.  Psychiatric: She has a normal mood and affect. Her behavior is normal.    ED Course  Procedures (including  critical care time)  DIAGNOSTIC STUDIES: Oxygen Saturation is 98% on room air, normal by my interpretation.    COORDINATION OF CARE: 12:52PM- Patient heart rate measured 108 BPM on monitor at this time which is decreased from initial rate captured on EKG.  2:33PM- Patient informed of lab and imaging results. Heart rate 78 BPM and in a normal sinus rhythm on monitor. Patient advised to see Dr. Juanetta Gosling for follow up next week. Patient informed of intent to d/c home.   Labs Reviewed  COMPREHENSIVE METABOLIC PANEL - Abnormal; Notable for the following:    Glucose, Bld 120 (*)    GFR calc non Af Amer 68 (*)    GFR calc Af Amer 79 (*)    All other components within normal limits  CBC  DIFFERENTIAL  CARDIAC PANEL(CRET KIN+CKTOT+MB+TROPI)   Dg Chest Portable 1 View  10/22/2011  *RADIOLOGY REPORT*  Clinical Data: Tachycardia, shortness of breath, asthma, hypertension  PORTABLE CHEST - 1 VIEW  Comparison: Portable exam 1253 hours compared to 06/13/2009  Findings: Normal heart size, mediastinal contours, and pulmonary vascularity. Atherosclerotic calcification aortic arch. Lungs appear emphysematous with chronic scarring adjacent to left heart border. No pulmonary infiltrate, pleural effusion, or pneumothorax. Bones appear demineralized.  IMPRESSION: Emphysematous changes with scarring adjacent to left heart border. No acute abnormalities.  Original Report Authenticated By: Lollie Marrow, M.D.     1. Atrial fib/flutter, transient       MDM   Date: 10/22/2011  [id number]  Rhythm: atrial fibrillation  QRS Axis: normal  Intervals: normal  ST/T Wave abnormalities: nonspecific ST changes  Conduction Disutrbances:none  Narrative Interpretation:   Old EKG Reviewed: none available    The chart was scribed for me under my direct supervision.  I personally performed the history, physical, and medical decision making and all procedures in the evaluation of this patient.Benny Lennert,  MD 10/22/11 1501

## 2011-10-22 NOTE — ED Notes (Signed)
Up to BR with assistance.  Tolerated well.  Cont . Sinus rhythm.

## 2011-10-22 NOTE — ED Notes (Signed)
Sinus rhythm

## 2011-10-22 NOTE — ED Notes (Signed)
App 9a  Feel"bad",  Says heart was racing,  Sl sob.  No chest pain.

## 2011-11-25 ENCOUNTER — Ambulatory Visit: Payer: Medicare Other | Admitting: Cardiology

## 2011-11-25 ENCOUNTER — Encounter: Payer: Self-pay | Admitting: Cardiology

## 2011-11-25 ENCOUNTER — Ambulatory Visit (INDEPENDENT_AMBULATORY_CARE_PROVIDER_SITE_OTHER): Payer: Medicare Other | Admitting: Cardiology

## 2011-11-25 DIAGNOSIS — I498 Other specified cardiac arrhythmias: Secondary | ICD-10-CM

## 2011-11-25 DIAGNOSIS — I509 Heart failure, unspecified: Secondary | ICD-10-CM

## 2011-11-25 DIAGNOSIS — R002 Palpitations: Secondary | ICD-10-CM | POA: Diagnosis not present

## 2011-11-25 DIAGNOSIS — I5040 Unspecified combined systolic (congestive) and diastolic (congestive) heart failure: Secondary | ICD-10-CM | POA: Diagnosis not present

## 2011-11-25 DIAGNOSIS — E785 Hyperlipidemia, unspecified: Secondary | ICD-10-CM

## 2011-11-25 DIAGNOSIS — I10 Essential (primary) hypertension: Secondary | ICD-10-CM

## 2011-11-25 MED ORDER — WARFARIN SODIUM 5 MG PO TABS: 5.0000 mg | ORAL_TABLET | Freq: Every day | ORAL | Status: DC

## 2011-11-25 MED ORDER — DILTIAZEM HCL 60 MG PO TABS: 60.0000 mg | ORAL_TABLET | ORAL | Status: DC | PRN

## 2011-11-25 MED ORDER — WARFARIN SODIUM 2.5 MG PO TABS: 2.5000 mg | ORAL_TABLET | Freq: Every day | ORAL | Status: DC

## 2011-11-25 NOTE — Assessment & Plan Note (Signed)
Worse as outlined under atrial arrhythmias. No changes in meds.

## 2011-11-25 NOTE — Assessment & Plan Note (Addendum)
Recurrent, unpredictable and symptomatic. I have prescribed p.r.n. Immediate release diltiazem 60 mg p.r.n. Hopefully, we keep her at the emergency room. Looking back at the emergency room EKG, it appears to be A. Fib. I prescribed Coumadin and we'll put in the Coumadin clinic. All questions answered.

## 2011-11-25 NOTE — Patient Instructions (Addendum)
**Note De-Identified  Obfuscation** Your physician has recommended you make the following change in your medication: start taking Diltiazem 60 mg as needed for palpitations and Coumadin 2.5 mg daily  Your physician recommends that you schedule a follow-up appointment in: 12 months  We will enroll you in our Coumadin clinic before you leave visit today

## 2011-11-25 NOTE — Assessment & Plan Note (Signed)
Stable. Continued good blood pressure control.

## 2011-11-25 NOTE — Progress Notes (Signed)
HPI [name] returns today after being evaluated in the emergency room with tachycardia. She has a history of atrial arrhythmias. She was given an extra dose of diltiazem in the ED. She was not admitted. Looking at the EKG, she had A. Fib with a rapid rate.  Her blood pressure is under good control. She is on a statin for hyperlipidemia. She takes aspirin.  Her  palpitations are random and happening about once every couple months. She thinks is stress related. It is not exercise related. She denies any chest pain or angina. She denies orthopnea, PND or edema.    Past Medical History  Diagnosis Date  . SVT (supraventricular tachycardia)   . Hypertension     Current Outpatient Prescriptions  Medication Sig Dispense Refill  . aspirin 81 MG tablet Take 81 mg by mouth daily.        . benazepril-hydrochlorthiazide (LOTENSIN HCT) 10-12.5 MG per tablet Take 0.5 tablets by mouth daily.       . budesonide-formoterol (SYMBICORT) 160-4.5 MCG/ACT inhaler Inhale 2 puffs into the lungs 2 (two) times daily.        Marland Kitchen diltiazem (CARDIZEM CD) 240 MG 24 hr capsule Take 240 mg by mouth daily.        . fish oil-omega-3 fatty acids 1000 MG capsule Take 1 g by mouth daily.        Marland Kitchen levocetirizine (XYZAL) 5 MG tablet Take 5 mg by mouth daily.        . montelukast (SINGULAIR) 10 MG tablet Take 10 mg by mouth at bedtime.        . Multiple Vitamins-Minerals (MULTIVITAMIN WITH MINERALS) tablet Take 1 tablet by mouth daily.        . rosuvastatin (CRESTOR) 10 MG tablet Take 10 mg by mouth daily.        Marland Kitchen topiramate (TOPAMAX) 25 MG tablet Take 25 mg by mouth 2 (two) times daily.        Marland Kitchen zolpidem (AMBIEN) 5 MG tablet Take 2.5 mg by mouth at bedtime as needed. sleep        Allergies  Allergen Reactions  . Codeine   . Penicillins     No family history on file.  History   Social History  . Marital Status: Married    Spouse Name: N/A    Number of Children: N/A  . Years of Education: N/A   Occupational  History  . Not on file.   Social History Main Topics  . Smoking status: Former Smoker -- 1.0 packs/day    Types: Cigarettes    Quit date: 01/07/1991  . Smokeless tobacco: Never Used  . Alcohol Use: No  . Drug Use: No  . Sexually Active: Not on file   Other Topics Concern  . Not on file   Social History Narrative  . No narrative on file    ROS ALL NEGATIVE EXCEPT THOSE NOTED IN HPI  PE  General Appearance: well developed, well nourished in no acute distress HEENT: symmetrical face, PERRLA, good dentition  Neck: no JVD, thyromegaly, or adenopathy, trachea midline Chest: symmetric without deformity Cardiac: PMI non-displaced, RRR, normal S1, S2, no gallop or murmur Lung: clear to ausculation and percussion Vascular: all pulses full without bruits  Abdominal: nondistended, nontender, good bowel sounds, no HSM, no bruits Extremities: no cyanosis, clubbing or edema, no sign of DVT, no varicosities  Skin: normal color, no rashes Neuro: alert and oriented x 3, non-focal Pysch: normal affect  EKG  BMET  Component Value Date/Time   NA 137 10/22/2011 1249   K 3.5 10/22/2011 1249   CL 105 10/22/2011 1249   CO2 25 10/22/2011 1249   GLUCOSE 120* 10/22/2011 1249   BUN 16 10/22/2011 1249   CREATININE 0.82 10/22/2011 1249   CALCIUM 10.2 10/22/2011 1249   GFRNONAA 68* 10/22/2011 1249   GFRAA 79* 10/22/2011 1249    Lipid Panel     Component Value Date/Time   CHOL 276 07/18/2009   TRIG 523 07/18/2009   HDL 59 07/18/2009   LDLCALC not calc 07/18/2009    CBC    Component Value Date/Time   WBC 7.2 10/22/2011 1239   RBC 4.16 10/22/2011 1239   HGB 12.6 10/22/2011 1239   HCT 37.1 10/22/2011 1239   PLT 316 10/22/2011 1239   MCV 89.2 10/22/2011 1239   MCH 30.3 10/22/2011 1239   MCHC 34.0 10/22/2011 1239   RDW 12.9 10/22/2011 1239   LYMPHSABS 2.1 10/22/2011 1239   MONOABS 0.6 10/22/2011 1239   EOSABS 0.2 10/22/2011 1239   BASOSABS 0.1 10/22/2011 1239

## 2011-11-26 ENCOUNTER — Encounter: Payer: Medicare Other | Admitting: *Deleted

## 2011-11-26 ENCOUNTER — Ambulatory Visit (INDEPENDENT_AMBULATORY_CARE_PROVIDER_SITE_OTHER): Payer: Medicare Other | Admitting: *Deleted

## 2011-11-26 DIAGNOSIS — Z7901 Long term (current) use of anticoagulants: Secondary | ICD-10-CM | POA: Diagnosis not present

## 2011-11-26 DIAGNOSIS — I4891 Unspecified atrial fibrillation: Secondary | ICD-10-CM

## 2011-11-26 DIAGNOSIS — I480 Paroxysmal atrial fibrillation: Secondary | ICD-10-CM | POA: Insufficient documentation

## 2011-11-30 ENCOUNTER — Ambulatory Visit (INDEPENDENT_AMBULATORY_CARE_PROVIDER_SITE_OTHER): Payer: Medicare Other | Admitting: *Deleted

## 2011-11-30 DIAGNOSIS — I4891 Unspecified atrial fibrillation: Secondary | ICD-10-CM

## 2011-11-30 DIAGNOSIS — Z7901 Long term (current) use of anticoagulants: Secondary | ICD-10-CM | POA: Diagnosis not present

## 2011-11-30 LAB — POCT INR: INR: 1.4

## 2011-12-07 ENCOUNTER — Ambulatory Visit (INDEPENDENT_AMBULATORY_CARE_PROVIDER_SITE_OTHER): Payer: Medicare Other | Admitting: *Deleted

## 2011-12-07 DIAGNOSIS — I4891 Unspecified atrial fibrillation: Secondary | ICD-10-CM

## 2011-12-07 DIAGNOSIS — Z7901 Long term (current) use of anticoagulants: Secondary | ICD-10-CM

## 2011-12-07 LAB — POCT INR: INR: 2.5

## 2011-12-11 ENCOUNTER — Ambulatory Visit: Payer: Medicare Other | Admitting: Cardiology

## 2011-12-14 ENCOUNTER — Ambulatory Visit (INDEPENDENT_AMBULATORY_CARE_PROVIDER_SITE_OTHER): Payer: Medicare Other | Admitting: *Deleted

## 2011-12-14 DIAGNOSIS — Z7901 Long term (current) use of anticoagulants: Secondary | ICD-10-CM

## 2011-12-14 DIAGNOSIS — I4891 Unspecified atrial fibrillation: Secondary | ICD-10-CM

## 2011-12-21 ENCOUNTER — Ambulatory Visit (INDEPENDENT_AMBULATORY_CARE_PROVIDER_SITE_OTHER): Payer: Medicare Other | Admitting: *Deleted

## 2011-12-21 DIAGNOSIS — I4891 Unspecified atrial fibrillation: Secondary | ICD-10-CM

## 2011-12-21 DIAGNOSIS — Z7901 Long term (current) use of anticoagulants: Secondary | ICD-10-CM | POA: Diagnosis not present

## 2012-01-04 ENCOUNTER — Ambulatory Visit (INDEPENDENT_AMBULATORY_CARE_PROVIDER_SITE_OTHER): Payer: Medicare Other | Admitting: *Deleted

## 2012-01-04 DIAGNOSIS — I4891 Unspecified atrial fibrillation: Secondary | ICD-10-CM

## 2012-01-04 DIAGNOSIS — J449 Chronic obstructive pulmonary disease, unspecified: Secondary | ICD-10-CM | POA: Diagnosis not present

## 2012-01-04 DIAGNOSIS — I10 Essential (primary) hypertension: Secondary | ICD-10-CM | POA: Diagnosis not present

## 2012-01-04 DIAGNOSIS — Z7901 Long term (current) use of anticoagulants: Secondary | ICD-10-CM | POA: Diagnosis not present

## 2012-01-04 DIAGNOSIS — J3089 Other allergic rhinitis: Secondary | ICD-10-CM | POA: Diagnosis not present

## 2012-01-04 LAB — POCT INR: INR: 1.8

## 2012-01-18 ENCOUNTER — Ambulatory Visit (INDEPENDENT_AMBULATORY_CARE_PROVIDER_SITE_OTHER): Payer: Medicare Other | Admitting: *Deleted

## 2012-01-18 DIAGNOSIS — I4891 Unspecified atrial fibrillation: Secondary | ICD-10-CM

## 2012-01-18 DIAGNOSIS — Z7901 Long term (current) use of anticoagulants: Secondary | ICD-10-CM

## 2012-01-18 LAB — POCT INR: INR: 2

## 2012-01-29 DIAGNOSIS — J301 Allergic rhinitis due to pollen: Secondary | ICD-10-CM | POA: Diagnosis not present

## 2012-01-29 DIAGNOSIS — J3089 Other allergic rhinitis: Secondary | ICD-10-CM | POA: Diagnosis not present

## 2012-01-29 DIAGNOSIS — H1045 Other chronic allergic conjunctivitis: Secondary | ICD-10-CM | POA: Diagnosis not present

## 2012-01-29 DIAGNOSIS — J45909 Unspecified asthma, uncomplicated: Secondary | ICD-10-CM | POA: Diagnosis not present

## 2012-02-01 ENCOUNTER — Ambulatory Visit (INDEPENDENT_AMBULATORY_CARE_PROVIDER_SITE_OTHER): Payer: Medicare Other | Admitting: *Deleted

## 2012-02-01 DIAGNOSIS — I4891 Unspecified atrial fibrillation: Secondary | ICD-10-CM | POA: Diagnosis not present

## 2012-02-01 DIAGNOSIS — Z7901 Long term (current) use of anticoagulants: Secondary | ICD-10-CM

## 2012-02-22 ENCOUNTER — Ambulatory Visit (INDEPENDENT_AMBULATORY_CARE_PROVIDER_SITE_OTHER): Payer: Medicare Other | Admitting: *Deleted

## 2012-02-22 DIAGNOSIS — I4891 Unspecified atrial fibrillation: Secondary | ICD-10-CM | POA: Diagnosis not present

## 2012-02-22 DIAGNOSIS — Z7901 Long term (current) use of anticoagulants: Secondary | ICD-10-CM

## 2012-02-22 LAB — POCT INR: INR: 2.5

## 2012-03-21 ENCOUNTER — Ambulatory Visit (INDEPENDENT_AMBULATORY_CARE_PROVIDER_SITE_OTHER): Payer: Medicare Other | Admitting: *Deleted

## 2012-03-21 DIAGNOSIS — Z7901 Long term (current) use of anticoagulants: Secondary | ICD-10-CM

## 2012-03-21 DIAGNOSIS — I4891 Unspecified atrial fibrillation: Secondary | ICD-10-CM

## 2012-03-21 LAB — POCT INR: INR: 2.5

## 2012-04-14 DIAGNOSIS — I10 Essential (primary) hypertension: Secondary | ICD-10-CM | POA: Diagnosis not present

## 2012-04-14 DIAGNOSIS — J449 Chronic obstructive pulmonary disease, unspecified: Secondary | ICD-10-CM | POA: Diagnosis not present

## 2012-04-14 DIAGNOSIS — E785 Hyperlipidemia, unspecified: Secondary | ICD-10-CM | POA: Diagnosis not present

## 2012-04-14 DIAGNOSIS — J309 Allergic rhinitis, unspecified: Secondary | ICD-10-CM | POA: Diagnosis not present

## 2012-04-18 ENCOUNTER — Ambulatory Visit (INDEPENDENT_AMBULATORY_CARE_PROVIDER_SITE_OTHER): Payer: Medicare Other | Admitting: *Deleted

## 2012-04-18 DIAGNOSIS — Z7901 Long term (current) use of anticoagulants: Secondary | ICD-10-CM

## 2012-04-18 DIAGNOSIS — I4891 Unspecified atrial fibrillation: Secondary | ICD-10-CM | POA: Diagnosis not present

## 2012-05-30 ENCOUNTER — Ambulatory Visit (INDEPENDENT_AMBULATORY_CARE_PROVIDER_SITE_OTHER): Payer: Medicare Other | Admitting: *Deleted

## 2012-05-30 DIAGNOSIS — Z7901 Long term (current) use of anticoagulants: Secondary | ICD-10-CM

## 2012-05-30 DIAGNOSIS — I4891 Unspecified atrial fibrillation: Secondary | ICD-10-CM

## 2012-06-01 DIAGNOSIS — E785 Hyperlipidemia, unspecified: Secondary | ICD-10-CM | POA: Diagnosis not present

## 2012-06-01 DIAGNOSIS — I10 Essential (primary) hypertension: Secondary | ICD-10-CM | POA: Diagnosis not present

## 2012-06-21 ENCOUNTER — Other Ambulatory Visit (HOSPITAL_COMMUNITY): Payer: Self-pay | Admitting: Pulmonary Disease

## 2012-06-21 DIAGNOSIS — Z139 Encounter for screening, unspecified: Secondary | ICD-10-CM

## 2012-07-08 DIAGNOSIS — Z23 Encounter for immunization: Secondary | ICD-10-CM | POA: Diagnosis not present

## 2012-07-13 ENCOUNTER — Ambulatory Visit (INDEPENDENT_AMBULATORY_CARE_PROVIDER_SITE_OTHER): Payer: Medicare Other | Admitting: *Deleted

## 2012-07-13 DIAGNOSIS — I4891 Unspecified atrial fibrillation: Secondary | ICD-10-CM

## 2012-07-13 DIAGNOSIS — Z7901 Long term (current) use of anticoagulants: Secondary | ICD-10-CM | POA: Diagnosis not present

## 2012-07-13 LAB — POCT INR: INR: 3.1

## 2012-08-01 ENCOUNTER — Ambulatory Visit (HOSPITAL_COMMUNITY)
Admission: RE | Admit: 2012-08-01 | Discharge: 2012-08-01 | Disposition: A | Discharge status: Home / Self Care | Payer: Medicare Other | Source: Ambulatory Visit | Attending: Pulmonary Disease | Admitting: Pulmonary Disease

## 2012-08-01 DIAGNOSIS — Z139 Encounter for screening, unspecified: Secondary | ICD-10-CM

## 2012-08-01 DIAGNOSIS — Z1231 Encounter for screening mammogram for malignant neoplasm of breast: Secondary | ICD-10-CM | POA: Diagnosis not present

## 2012-08-24 ENCOUNTER — Ambulatory Visit (INDEPENDENT_AMBULATORY_CARE_PROVIDER_SITE_OTHER): Payer: Medicare Other | Admitting: *Deleted

## 2012-08-24 DIAGNOSIS — I4891 Unspecified atrial fibrillation: Secondary | ICD-10-CM | POA: Diagnosis not present

## 2012-08-24 DIAGNOSIS — Z7901 Long term (current) use of anticoagulants: Secondary | ICD-10-CM | POA: Diagnosis not present

## 2012-08-24 LAB — POCT INR: INR: 3.3

## 2012-08-26 DIAGNOSIS — J301 Allergic rhinitis due to pollen: Secondary | ICD-10-CM | POA: Diagnosis not present

## 2012-08-26 DIAGNOSIS — J45909 Unspecified asthma, uncomplicated: Secondary | ICD-10-CM | POA: Diagnosis not present

## 2012-08-26 DIAGNOSIS — J3089 Other allergic rhinitis: Secondary | ICD-10-CM | POA: Diagnosis not present

## 2012-08-26 DIAGNOSIS — H1045 Other chronic allergic conjunctivitis: Secondary | ICD-10-CM | POA: Diagnosis not present

## 2012-09-19 ENCOUNTER — Ambulatory Visit (INDEPENDENT_AMBULATORY_CARE_PROVIDER_SITE_OTHER): Payer: Medicare Other | Admitting: *Deleted

## 2012-09-19 DIAGNOSIS — I4891 Unspecified atrial fibrillation: Secondary | ICD-10-CM

## 2012-09-19 DIAGNOSIS — Z7901 Long term (current) use of anticoagulants: Secondary | ICD-10-CM | POA: Diagnosis not present

## 2012-09-19 LAB — POCT INR: INR: 2.4

## 2012-10-07 DIAGNOSIS — I10 Essential (primary) hypertension: Secondary | ICD-10-CM | POA: Diagnosis not present

## 2012-10-07 DIAGNOSIS — Z7901 Long term (current) use of anticoagulants: Secondary | ICD-10-CM | POA: Diagnosis not present

## 2012-10-07 DIAGNOSIS — I4891 Unspecified atrial fibrillation: Secondary | ICD-10-CM | POA: Diagnosis not present

## 2012-10-07 DIAGNOSIS — G4700 Insomnia, unspecified: Secondary | ICD-10-CM | POA: Diagnosis not present

## 2012-10-17 ENCOUNTER — Ambulatory Visit (INDEPENDENT_AMBULATORY_CARE_PROVIDER_SITE_OTHER): Payer: Medicare Other | Admitting: *Deleted

## 2012-10-17 DIAGNOSIS — Z7901 Long term (current) use of anticoagulants: Secondary | ICD-10-CM

## 2012-10-17 DIAGNOSIS — I4891 Unspecified atrial fibrillation: Secondary | ICD-10-CM

## 2012-10-17 LAB — POCT INR: INR: 2.2

## 2012-11-01 ENCOUNTER — Ambulatory Visit: Payer: Medicare Other | Admitting: Urgent Care

## 2012-11-22 ENCOUNTER — Ambulatory Visit (INDEPENDENT_AMBULATORY_CARE_PROVIDER_SITE_OTHER): Payer: Medicare Other | Admitting: Cardiology

## 2012-11-22 ENCOUNTER — Encounter: Payer: Self-pay | Admitting: Cardiology

## 2012-11-22 VITALS — BP 136/80 | HR 74 | Ht 63.0 in | Wt 140.0 lb

## 2012-11-22 DIAGNOSIS — I10 Essential (primary) hypertension: Secondary | ICD-10-CM | POA: Diagnosis not present

## 2012-11-22 DIAGNOSIS — I4891 Unspecified atrial fibrillation: Secondary | ICD-10-CM

## 2012-11-22 DIAGNOSIS — I5042 Chronic combined systolic (congestive) and diastolic (congestive) heart failure: Secondary | ICD-10-CM

## 2012-11-22 DIAGNOSIS — E785 Hyperlipidemia, unspecified: Secondary | ICD-10-CM

## 2012-11-22 DIAGNOSIS — I509 Heart failure, unspecified: Secondary | ICD-10-CM

## 2012-11-22 DIAGNOSIS — Z7901 Long term (current) use of anticoagulants: Secondary | ICD-10-CM

## 2012-11-22 MED ORDER — DILTIAZEM HCL 60 MG PO TABS: 60.0000 mg | ORAL_TABLET | ORAL | Status: DC | PRN

## 2012-11-22 NOTE — Progress Notes (Signed)
HPI [name] returns today for her history of paroxysmal atrial fibrillation. Since last year, she's had no recurrent events. She has not had to take when necessary diltiazem. She denies any problems with anticoagulation and denies any bleeding. She's very compliant with all medications. Lab work is followed by Dr. Juanetta Gosling.  Past Medical History  Diagnosis Date  . SVT (supraventricular tachycardia)   . Hypertension     Current Outpatient Prescriptions  Medication Sig Dispense Refill  . benazepril-hydrochlorthiazide (LOTENSIN HCT) 10-12.5 MG per tablet Take 0.5 tablets by mouth daily.       . budesonide-formoterol (SYMBICORT) 160-4.5 MCG/ACT inhaler Inhale 2 puffs into the lungs 2 (two) times daily.        Marland Kitchen diltiazem (CARDIZEM CD) 240 MG 24 hr capsule Take 240 mg by mouth daily.        Marland Kitchen diltiazem (CARDIZEM) 60 MG tablet Take 1 tablet (60 mg total) by mouth as needed. For palpitations/pt. is also taking Diltiazem CD 240 mg daily  180 tablet  3  . fish oil-omega-3 fatty acids 1000 MG capsule Take 1 g by mouth daily.        Marland Kitchen levocetirizine (XYZAL) 5 MG tablet Take 5 mg by mouth daily.        . montelukast (SINGULAIR) 10 MG tablet Take 10 mg by mouth at bedtime.        . Multiple Vitamins-Minerals (MULTIVITAMIN WITH MINERALS) tablet Take 1 tablet by mouth daily.        . rosuvastatin (CRESTOR) 10 MG tablet Take 10 mg by mouth daily.        Marland Kitchen topiramate (TOPAMAX) 25 MG tablet Take 25 mg by mouth 2 (two) times daily.        Marland Kitchen warfarin (COUMADIN) 5 MG tablet Take 1 tablet (5 mg total) by mouth daily.  135 tablet  0  . zolpidem (AMBIEN) 5 MG tablet Take 2.5 mg by mouth at bedtime as needed. sleep        Allergies  Allergen Reactions  . Codeine   . Penicillins     No family history on file.  History   Social History  . Marital Status: Married    Spouse Name: N/A    Number of Children: N/A  . Years of Education: N/A   Occupational History  . Not on file.   Social History Main  Topics  . Smoking status: Former Smoker -- 1.0 packs/day    Types: Cigarettes    Quit date: 01/07/1991  . Smokeless tobacco: Never Used  . Alcohol Use: No  . Drug Use: No  . Sexually Active: Not on file   Other Topics Concern  . Not on file   Social History Narrative  . No narrative on file    ROS ALL NEGATIVE EXCEPT THOSE NOTED IN HPI  PE  General Appearance: well developed, well nourished in no acute distress HEENT: symmetrical face, PERRLA, good dentition  Neck: no JVD, thyromegaly, or adenopathy, trachea midline Chest: symmetric without deformity Cardiac: PMI non-displaced, RRR, normal S1, S2, no gallop or murmur Lung: clear to ausculation and percussion Vascular: all pulses full without bruits  Abdominal: nondistended, nontender, good bowel sounds, no HSM, no bruits Extremities: no cyanosis, clubbing or edema, no sign of DVT, no varicosities  Skin: normal color, no rashes Neuro: alert and oriented x 3, non-focal Pysch: normal affect  EKG Normal sinus rhythm, normal EKG  BMET    Component Value Date/Time   NA 137 10/22/2011 1249  K 3.5 10/22/2011 1249   CL 105 10/22/2011 1249   CO2 25 10/22/2011 1249   GLUCOSE 120* 10/22/2011 1249   BUN 16 10/22/2011 1249   CREATININE 0.82 10/22/2011 1249   CALCIUM 10.2 10/22/2011 1249   GFRNONAA 68* 10/22/2011 1249   GFRAA 79* 10/22/2011 1249    Lipid Panel     Component Value Date/Time   CHOL 276 07/18/2009   TRIG 523 07/18/2009   HDL 59 07/18/2009   LDLCALC not calc 07/18/2009    CBC    Component Value Date/Time   WBC 7.2 10/22/2011 1239   RBC 4.16 10/22/2011 1239   HGB 12.6 10/22/2011 1239   HCT 37.1 10/22/2011 1239   PLT 316 10/22/2011 1239   MCV 89.2 10/22/2011 1239   MCH 30.3 10/22/2011 1239   MCHC 34.0 10/22/2011 1239   RDW 12.9 10/22/2011 1239   LYMPHSABS 2.1 10/22/2011 1239   MONOABS 0.6 10/22/2011 1239   EOSABS 0.2 10/22/2011 1239   BASOSABS 0.1 10/22/2011 1239

## 2012-11-22 NOTE — Patient Instructions (Addendum)
Your physician recommends that you schedule a follow-up appointment in: ONE YEAR WITH TW 

## 2012-11-22 NOTE — Assessment & Plan Note (Signed)
Maintaining sinus rhythm. As needed diltiazem renewed and game plan  Reinforced when A. fib recurs. Will followup in one year.

## 2012-11-22 NOTE — Addendum Note (Signed)
Addended by: Derry Lory A on: 11/22/2012 03:13 PM   Modules accepted: Orders

## 2012-11-22 NOTE — Assessment & Plan Note (Signed)
Stable. Continue good blood pressure control.

## 2012-11-24 MED ORDER — DILTIAZEM HCL 60 MG PO TABS: 60.0000 mg | ORAL_TABLET | ORAL | Status: DC | PRN

## 2012-11-24 NOTE — Addendum Note (Signed)
Addended by: Derry Lory A on: 11/24/2012 08:17 AM   Modules accepted: Orders

## 2012-11-30 ENCOUNTER — Ambulatory Visit (INDEPENDENT_AMBULATORY_CARE_PROVIDER_SITE_OTHER): Payer: Medicare Other | Admitting: *Deleted

## 2012-11-30 DIAGNOSIS — Z7901 Long term (current) use of anticoagulants: Secondary | ICD-10-CM

## 2012-11-30 DIAGNOSIS — I4891 Unspecified atrial fibrillation: Secondary | ICD-10-CM | POA: Diagnosis not present

## 2012-12-07 ENCOUNTER — Ambulatory Visit: Payer: Medicare Other | Admitting: Cardiology

## 2012-12-14 ENCOUNTER — Encounter (INDEPENDENT_AMBULATORY_CARE_PROVIDER_SITE_OTHER): Payer: Self-pay | Admitting: *Deleted

## 2012-12-19 ENCOUNTER — Ambulatory Visit (INDEPENDENT_AMBULATORY_CARE_PROVIDER_SITE_OTHER): Payer: Medicare Other | Admitting: Internal Medicine

## 2012-12-21 ENCOUNTER — Encounter (INDEPENDENT_AMBULATORY_CARE_PROVIDER_SITE_OTHER): Payer: Self-pay | Admitting: Internal Medicine

## 2012-12-21 ENCOUNTER — Ambulatory Visit (INDEPENDENT_AMBULATORY_CARE_PROVIDER_SITE_OTHER): Payer: Medicare Other | Admitting: Internal Medicine

## 2012-12-21 ENCOUNTER — Telehealth (INDEPENDENT_AMBULATORY_CARE_PROVIDER_SITE_OTHER): Payer: Self-pay | Admitting: *Deleted

## 2012-12-21 ENCOUNTER — Telehealth: Payer: Self-pay | Admitting: Cardiology

## 2012-12-21 ENCOUNTER — Other Ambulatory Visit (INDEPENDENT_AMBULATORY_CARE_PROVIDER_SITE_OTHER): Payer: Self-pay | Admitting: *Deleted

## 2012-12-21 VITALS — BP 118/48 | HR 60 | Temp 97.4°F | Ht 62.0 in | Wt 140.7 lb

## 2012-12-21 MED ORDER — PEG-KCL-NACL-NASULF-NA ASC-C 100 G PO SOLR: 1.0000 | Freq: Once | ORAL | Status: DC

## 2012-12-21 NOTE — Patient Instructions (Addendum)
Colonoscopy with Dr. Karilyn Cota. The risks and benefits such as perforation, bleeding, and infection were reviewed with the patient and is agreeable. Referral to Dr. Despina Hidden for prolapsed uterus.

## 2012-12-21 NOTE — Telephone Encounter (Signed)
Patient needs movi prep 

## 2012-12-21 NOTE — Progress Notes (Signed)
Subjective:     Patient ID: [name], female   DOB: November 26, 1933, 79 y.o.   MRN: [id number]  HPI see hpi Referred to our office by Dr. Juanetta Gosling for rectal bleeding. She tells me her rectal bleeding started about 6 months ago after starting the Coumadin. She sees blood everytime she goes to the bathroom.  She see the blood on her incontinence pad after going to the bathroom. Bright red in color. No rectal or abdominal pain. Stools are normal size. Appetite is good. No weight loss.  No acid reflux. No dysphagia. She has never undergone a colonoscopy in the past.  11/30/2012 INR 2.5. CBC    Component Value Date/Time   WBC 7.2 10/22/2011 1239   RBC 4.16 10/22/2011 1239   HGB 12.6 10/22/2011 1239   HCT 37.1 10/22/2011 1239   PLT 316 10/22/2011 1239   MCV 89.2 10/22/2011 1239   MCH 30.3 10/22/2011 1239   MCHC 34.0 10/22/2011 1239   RDW 12.9 10/22/2011 1239   LYMPHSABS 2.1 10/22/2011 1239   MONOABS 0.6 10/22/2011 1239   EOSABS 0.2 10/22/2011 1239   BASOSABS 0.1 10/22/2011 1239      Review of Systems see hpi. Current Outpatient Prescriptions  Medication Sig Dispense Refill  . benazepril-hydrochlorthiazide (LOTENSIN HCT) 10-12.5 MG per tablet Take 0.5 tablets by mouth daily.       . budesonide-formoterol (SYMBICORT) 160-4.5 MCG/ACT inhaler Inhale 2 puffs into the lungs 2 (two) times daily.        Marland Kitchen diltiazem (CARDIZEM CD) 240 MG 24 hr capsule Take 240 mg by mouth daily.        Marland Kitchen diltiazem (CARDIZEM) 60 MG tablet Take 1 tablet (60 mg total) by mouth as needed. For palpitations/pt. is also taking Diltiazem CD 240 mg daily  180 tablet  3  . fish oil-omega-3 fatty acids 1000 MG capsule Take 1 g by mouth daily.        Marland Kitchen levocetirizine (XYZAL) 5 MG tablet Take 5 mg by mouth daily.        . montelukast (SINGULAIR) 10 MG tablet Take 10 mg by mouth at bedtime.        . Multiple Vitamins-Minerals (MULTIVITAMIN WITH MINERALS) tablet Take 1 tablet by mouth daily.        . rosuvastatin (CRESTOR) 10 MG tablet Take 10 mg  by mouth daily.        Marland Kitchen topiramate (TOPAMAX) 25 MG tablet Take 25 mg by mouth 2 (two) times daily.        Marland Kitchen zolpidem (AMBIEN) 5 MG tablet Take 2.5 mg by mouth at bedtime as needed. sleep      . warfarin (COUMADIN) 5 MG tablet Take 1 tablet (5 mg total) by mouth daily.  135 tablet  0   No current facility-administered medications for this visit.   Past Medical History  Diagnosis Date  . SVT (supraventricular tachycardia)   . Hypertension   . Atrial fibrillation    Past Surgical History  Procedure Laterality Date  . Breast biopsy     Allergies  Allergen Reactions  . Codeine   . Penicillins         Objective:   Physical Exam    Alert and oriented. Skin warm and dry. Oral mucosa is moist.   . Sclera anicteric, conjunctivae is pink. Thyroid not enlarged. No cervical lymphadenopathy. Lungs clear. Heart regular rate and rhythm.  Abdomen is soft. Bowel sounds are positive. No hepatomegaly. No abdominal masses felt. No tenderness.  No edema to lower extremities.   Stool brown and grossly guaiac positive. Patient appears to have prolapsed uterus with some blooding from cervix noted. Assessment:   Rectal bleeding. Colonic neoplasm, AVM, ulcer, hemorrhoids needs to be ruled out. Patient has never undergone a colonoscopy in the past.    Plan:    Colonoscopy. The risks and benefits such as perforation, bleeding, and infection were reviewed with the patient and is agreeable. Referral to GYN: Dr. Despina Hidden for evaluation of prolapsed uterus.

## 2012-12-21 NOTE — Telephone Encounter (Addendum)
Patient scheduled for colonoscopy on 3/13.  Needs to be off Coumadin 5 days prior to procedure.  Is this ok? / tgs  Patient has been followed by Dr. Daleen Squibb. Please forward question to his attention.

## 2012-12-21 NOTE — Telephone Encounter (Signed)
Please advise 

## 2012-12-22 ENCOUNTER — Encounter: Payer: Self-pay | Admitting: Cardiology

## 2012-12-23 ENCOUNTER — Encounter (HOSPITAL_COMMUNITY): Payer: Self-pay | Admitting: Pharmacy Technician

## 2012-12-26 NOTE — Telephone Encounter (Signed)
Called pt to clarify if she has held her coumadin up to this point per noted pt to have procedure in 3 days, pt clarified that she stopped taking the coumadin on 12-25-12 please advise

## 2012-12-27 NOTE — Telephone Encounter (Signed)
Spoke to pt to advise results/instructions. Pt understood. Pt will restart after procedure

## 2012-12-27 NOTE — Telephone Encounter (Signed)
Proceed with colonoscopy and restart Coumadin.

## 2012-12-29 ENCOUNTER — Encounter (HOSPITAL_COMMUNITY)
Admission: RE | Disposition: A | Discharge status: Home / Self Care | Payer: Self-pay | Source: Ambulatory Visit | Attending: Internal Medicine

## 2012-12-29 ENCOUNTER — Encounter (HOSPITAL_COMMUNITY): Payer: Self-pay | Admitting: *Deleted

## 2012-12-29 ENCOUNTER — Ambulatory Visit (HOSPITAL_COMMUNITY)
Admission: RE | Admit: 2012-12-29 | Discharge: 2012-12-29 | Disposition: A | Discharge status: Home / Self Care | Payer: Medicare Other | Source: Ambulatory Visit | Attending: Internal Medicine | Admitting: Internal Medicine

## 2012-12-29 DIAGNOSIS — D128 Benign neoplasm of rectum: Secondary | ICD-10-CM | POA: Diagnosis not present

## 2012-12-29 DIAGNOSIS — K5730 Diverticulosis of large intestine without perforation or abscess without bleeding: Secondary | ICD-10-CM | POA: Diagnosis not present

## 2012-12-29 DIAGNOSIS — D126 Benign neoplasm of colon, unspecified: Secondary | ICD-10-CM | POA: Insufficient documentation

## 2012-12-29 DIAGNOSIS — D129 Benign neoplasm of anus and anal canal: Secondary | ICD-10-CM

## 2012-12-29 DIAGNOSIS — K921 Melena: Secondary | ICD-10-CM | POA: Insufficient documentation

## 2012-12-29 DIAGNOSIS — I10 Essential (primary) hypertension: Secondary | ICD-10-CM | POA: Diagnosis not present

## 2012-12-29 DIAGNOSIS — Z7901 Long term (current) use of anticoagulants: Secondary | ICD-10-CM | POA: Insufficient documentation

## 2012-12-29 DIAGNOSIS — K644 Residual hemorrhoidal skin tags: Secondary | ICD-10-CM

## 2012-12-29 HISTORY — PX: COLONOSCOPY: SHX5424

## 2012-12-29 HISTORY — DX: Unspecified asthma, uncomplicated: J45.909

## 2012-12-29 SURGERY — COLONOSCOPY
Anesthesia: Moderate Sedation

## 2012-12-29 MED ORDER — MEPERIDINE HCL 50 MG/ML IJ SOLN
INTRAMUSCULAR | Status: AC
  Filled 2012-12-29: qty 1

## 2012-12-29 MED ORDER — MEPERIDINE HCL 50 MG/ML IJ SOLN
INTRAMUSCULAR | Status: DC | PRN
  Administered 2012-12-29 (×2): 25 mg via INTRAVENOUS

## 2012-12-29 MED ORDER — STERILE WATER FOR IRRIGATION IR SOLN
Status: DC | PRN
  Administered 2012-12-29: 12:00:00

## 2012-12-29 MED ORDER — MIDAZOLAM HCL 5 MG/5ML IJ SOLN
INTRAMUSCULAR | Status: AC
  Filled 2012-12-29: qty 10

## 2012-12-29 MED ORDER — MIDAZOLAM HCL 5 MG/5ML IJ SOLN
INTRAMUSCULAR | Status: DC | PRN
  Administered 2012-12-29: 1 mg via INTRAVENOUS
  Administered 2012-12-29 (×2): 2 mg via INTRAVENOUS

## 2012-12-29 MED ORDER — SODIUM CHLORIDE 0.45 % IV SOLN
INTRAVENOUS | Status: DC
  Administered 2012-12-29: 1000 mL via INTRAVENOUS

## 2012-12-29 NOTE — Op Note (Signed)
COLONOSCOPY PROCEDURE REPORT  PATIENT:  [name]  MR#:  [id number] Birthdate:  April 16, 1934, 79 y.o., female Endoscopist:  Dr. Malissa Hippo, MD Referred By:  Dr. Oneal Deputy. Juanetta Gosling, MD Procedure Date: 12/29/2012  Procedure:   Colonoscopy with snare polypectomy.  Indications: Patient is 79 year old Caucasian female who presents with 6 month history of intermittent hematochezia since she was begun on warfarin for atrial fibrillation. Patient has never been screened for CRC.  Informed Consent:  The procedure and risks were reviewed with the patient and informed consent was obtained.  Medications:  Demerol 50 mg IV Versed 5 mg IV  Description of procedure:  After a digital rectal exam was performed, that colonoscope was advanced from the anus through the rectum and colon to the area of the cecum, ileocecal valve and appendiceal orifice. The cecum was deeply intubated. These structures were well-seen and photographed for the record. From the level of the cecum and ileocecal valve, the scope was slowly and cautiously withdrawn. The mucosal surfaces were carefully surveyed utilizing scope tip to flexion to facilitate fold flattening as needed. The scope was pulled down into the rectum where a thorough exam including retroflexion was performed.  Findings:  Pseudomass perianal region secondary to prolapsed uterus. Prep satisfactory. Three small polyps ablated via cold biopsy and submitted together. One was at cecum and 2 at ascending colon. Two small polyps ablated via cold biopsy from hepatic flexure and submitted together. Multiple diverticula at sigmoid colon. 8 mm polyp snared from proximal rectum. 20 mm polyp with ulceration snared from distal rectum. Moist below the dentate line.   Therapeutic/Diagnostic Maneuvers Performed:  See above  Complications:  None  Cecal Withdrawal Time:  24 minutes  Impression:  Examination performed to cecum. Three small polyps ablated via cold  biopsy and submitted together as above. Two small polyps ablated via cold biopsy and submitted together as above. 8 mm polyp snared from rectum. 20 mm ulcerated polyp snared from distal rectum. His polyp is felt to be source of patient's hematochezia. Multiple diverticula at sigmoid colon. External/internal hemorrhoids. Perennial pseudomass secondary to prolapsed uterus.  Recommendations:  Standard instructions given. Patient will resume warfarin at usual dose today. High fiber diet. I will contact patient with biopsy results and further recommendations.  REHMAN,NAJEEB U  12/29/2012 12:56 PM  CC: Dr. Fredirick Maudlin, MD & Dr. Bonnetta Barry ref. provider found

## 2012-12-29 NOTE — H&P (Signed)
[name] is an 79 y.o. female.   Chief Complaint: Patient is here for colonoscopy. HPI: Patient is 79 year old Caucasian female who is here for diagnostic colonoscopy. Ever since patient has been on warfarin she has noted intermittent hematochezia (six months). She denies diarrhea constipation abdominal pain anorexia or weight loss. She has never been screened for colorectal carcinoma. Family history is negative for colorectal carcinoma.  Past Medical History  Diagnosis Date  . Hypertension   . SVT (supraventricular tachycardia)   . Atrial fibrillation   . Asthma     Past Surgical History  Procedure Laterality Date  . Breast biopsy      History reviewed. No pertinent family history. Social History:  reports that she quit smoking about 21 years ago. Her smoking use included Cigarettes. She smoked 1.00 pack per day. She has never used smokeless tobacco. She reports that she does not drink alcohol or use illicit drugs.  Allergies:  Allergies  Allergen Reactions  . Codeine   . Penicillins     Medications Prior to Admission  Medication Sig Dispense Refill  . benazepril-hydrochlorthiazide (LOTENSIN HCT) 10-12.5 MG per tablet Take 0.5 tablets by mouth daily.       . budesonide-formoterol (SYMBICORT) 160-4.5 MCG/ACT inhaler Inhale 2 puffs into the lungs 2 (two) times daily.        Marland Kitchen diltiazem (CARDIZEM CD) 240 MG 24 hr capsule Take 240 mg by mouth daily.        . fish oil-omega-3 fatty acids 1000 MG capsule Take 1 g by mouth daily.        Marland Kitchen levocetirizine (XYZAL) 5 MG tablet Take 5 mg by mouth daily.        . montelukast (SINGULAIR) 10 MG tablet Take 10 mg by mouth at bedtime.        . Multiple Vitamins-Minerals (MULTIVITAMIN WITH MINERALS) tablet Take 1 tablet by mouth daily.        . peg 3350 powder (MOVIPREP) 100 G SOLR Take 1 kit (100 g total) by mouth once.  1 kit  0  . rosuvastatin (CRESTOR) 10 MG tablet Take 10 mg by mouth daily.        Marland Kitchen topiramate (TOPAMAX) 25 MG  tablet Take 25 mg by mouth 2 (two) times daily.        Marland Kitchen warfarin (COUMADIN) 5 MG tablet Take 2.5-5 mg by mouth daily. 1/2 tab T-W-F. 5mg  on Thursday      . zolpidem (AMBIEN) 5 MG tablet Take 2.5 mg by mouth at bedtime as needed. sleep      . diltiazem (CARDIZEM) 60 MG tablet Take 1 tablet (60 mg total) by mouth as needed. For palpitations/pt. is also taking Diltiazem CD 240 mg daily  180 tablet  3    No results found for this or any previous visit (from the past 48 hour(s)). No results found.  ROS  Blood pressure 130/67, pulse 80, temperature 97.5 F (36.4 C), temperature source Oral, resp. rate 20, SpO2 97.00%. Physical Exam  Constitutional: She appears well-developed and well-nourished.  HENT:  Mouth/Throat: Oropharynx is clear and moist.  Eyes: Conjunctivae are normal. No scleral icterus.  Neck: No thyromegaly present.  Cardiovascular: Normal rate, regular rhythm and normal heart sounds.   No murmur heard. Respiratory: Effort normal and breath sounds normal.  GI: Soft. She exhibits no distension and no mass. There is no tenderness.  Small ecchymosis right mid abdomen  Musculoskeletal: She exhibits no edema.  Lymphadenopathy:    She has  no cervical adenopathy.  Neurological: She is alert.  Skin: Skin is warm and dry.     Assessment/Plan Intermittent hematochezia of 6 months duration. Diagnostic colonoscopy.  REHMAN,NAJEEB U 12/29/2012, 12:04 PM

## 2013-01-02 ENCOUNTER — Encounter (INDEPENDENT_AMBULATORY_CARE_PROVIDER_SITE_OTHER): Payer: Self-pay | Admitting: *Deleted

## 2013-01-02 DIAGNOSIS — N814 Uterovaginal prolapse, unspecified: Secondary | ICD-10-CM | POA: Diagnosis not present

## 2013-01-04 ENCOUNTER — Encounter (HOSPITAL_COMMUNITY): Payer: Self-pay | Admitting: Internal Medicine

## 2013-01-11 ENCOUNTER — Ambulatory Visit (INDEPENDENT_AMBULATORY_CARE_PROVIDER_SITE_OTHER): Payer: Medicare Other | Admitting: *Deleted

## 2013-01-11 DIAGNOSIS — Z7901 Long term (current) use of anticoagulants: Secondary | ICD-10-CM | POA: Diagnosis not present

## 2013-01-11 DIAGNOSIS — I4891 Unspecified atrial fibrillation: Secondary | ICD-10-CM | POA: Diagnosis not present

## 2013-01-18 ENCOUNTER — Other Ambulatory Visit: Payer: Self-pay | Admitting: Obstetrics and Gynecology

## 2013-01-18 ENCOUNTER — Ambulatory Visit (INDEPENDENT_AMBULATORY_CARE_PROVIDER_SITE_OTHER): Payer: Medicare Other | Admitting: Obstetrics and Gynecology

## 2013-01-18 ENCOUNTER — Other Ambulatory Visit (HOSPITAL_COMMUNITY)
Admission: RE | Admit: 2013-01-18 | Discharge: 2013-01-18 | Disposition: A | Discharge status: Home / Self Care | Payer: Medicare Other | Source: Ambulatory Visit | Attending: Obstetrics and Gynecology | Admitting: Obstetrics and Gynecology

## 2013-01-18 VITALS — BP 130/50 | Ht 63.0 in | Wt 141.0 lb

## 2013-01-18 DIAGNOSIS — N814 Uterovaginal prolapse, unspecified: Secondary | ICD-10-CM

## 2013-01-18 DIAGNOSIS — Z124 Encounter for screening for malignant neoplasm of cervix: Secondary | ICD-10-CM | POA: Insufficient documentation

## 2013-01-18 DIAGNOSIS — Z1151 Encounter for screening for human papillomavirus (HPV): Secondary | ICD-10-CM | POA: Insufficient documentation

## 2013-01-18 DIAGNOSIS — Z01818 Encounter for other preprocedural examination: Secondary | ICD-10-CM

## 2013-01-18 NOTE — Progress Notes (Signed)
[name] is an 79 y.o. female.. Menses being seen for uterovaginal prolapse, with cervix protruding 6-7 cm past the introitus which is relaxed.  Today's visit is for Pap smear ultrasound to assess the endometrium and discuss surgical options  Pertinent Gynecological History: Menses: post-menopausal Bleeding: None Contraception:  DES exposure:  Blood transfusions: none Sexually transmitted diseases: no past history Previous GYN Procedures: None  Last mammogram: normal Date: 2013, October Last pap:  Date: Over 5 years ago, done today 01/18/2013  OB History: G2, P2   Menstrual History: Menarche age:  No LMP recorded. Patient is postmenopausal.    Past Medical History  Diagnosis Date  . Hypertension   . SVT (supraventricular tachycardia)   . Atrial fibrillation   . Asthma     Past Surgical History  Procedure Laterality Date  . Breast biopsy    . Colonoscopy N/A 12/29/2012    Procedure: COLONOSCOPY;  Surgeon: Malissa Hippo, MD;  Location: [facility];  Service: Endoscopy;  Laterality: N/A;  12:15    No family history on file.  Social History:  reports that she quit smoking about 22 years ago. Her smoking use included Cigarettes. She smoked 1.00 pack per day. She has never used smokeless tobacco. She reports that she does not drink alcohol or use illicit drugs.  Allergies:  Allergies  Allergen Reactions  . Penicillins     "I don't remember the reaction", able to take Cephalosporins  . Codeine     Nausea     (Not in a hospital admission)  ROS GU: Positive for incomplete voiding sensation GI: Denies problems with defecation  Blood pressure 130/50, height 5\' 3"  (1.6 m), weight 141 lb (63.957 kg). Physical Exam Physical Examination: General appearance - alert, well appearing, and in no distress, oriented to person, place, and time, normal appearing weight and playful, active Mental status - alert, oriented to person, place, and time, normal mood, behavior,  speech, dress, motor activity, and thought processes Eyes - pupils equal and reactive, extraocular eye movements intact Chest - clear to auscultation, no wheezes, rales or rhonchi, symmetric air entry Heart - normal rate and regular rhythm Abdomen - soft, nontender, nondistended, no masses or organomegaly no rebound tenderness noted Pelvic - normal external genitalia, vulva, vagina, cervix, uterus and adnexa, VULVA:vulvar skin tag, right labia majora, VAGINA: Her labs of cervix, supportive structures approximately 6 cm through the  introitus which is very relaxed 3 fingerbreadths width, CERVIX: ectropion , UTERUS: retroverted, mobile, uterus measures retroverted 6.5 cm length 2.6 cm AP segments and 3.2 cm transverse with a 0.3 cm symmetric endometrial stripe on transvaginal ultrasound performed January 1 PAP: Pap smear done today rectal support has been weakened as well but she is not symptomatic at this time Extremities - peripheral pulses normal, no pedal edema, no clubbing or cyanosis    No results found for this or any previous visit (from the past 24 hour(s)).  No results found.  Assessment/Plan: Uterine prolapse with cystocele, rectocele Given brochures schedule will be made for vaginal hysterectomy, bilateral salpingo-oophorectomy, anterior and posterior repair  Trinka,Adilene Areola V 01/18/2013, 12:08 PM

## 2013-01-18 NOTE — Patient Instructions (Signed)
We will call you want surgeries been scheduled. If you've not heard from [place] in one week please call our office  followup

## 2013-01-20 DIAGNOSIS — N814 Uterovaginal prolapse, unspecified: Secondary | ICD-10-CM | POA: Insufficient documentation

## 2013-01-25 ENCOUNTER — Ambulatory Visit (INDEPENDENT_AMBULATORY_CARE_PROVIDER_SITE_OTHER): Payer: Medicare Other | Admitting: *Deleted

## 2013-01-25 DIAGNOSIS — I4891 Unspecified atrial fibrillation: Secondary | ICD-10-CM

## 2013-01-25 DIAGNOSIS — Z7901 Long term (current) use of anticoagulants: Secondary | ICD-10-CM

## 2013-01-25 LAB — POCT INR: INR: 2.4

## 2013-01-26 ENCOUNTER — Encounter (HOSPITAL_COMMUNITY): Payer: Self-pay | Admitting: Pharmacy Technician

## 2013-01-31 NOTE — Patient Instructions (Addendum)
[name]  01/31/2013   Your procedure is scheduled on:  02/07/2013  Report to [facility] at  900  AM.  Call this number if you have problems the morning of surgery: [phone]   Remember:   Do not eat food or drink liquids after midnight.   Take these medicines the morning of surgery with A SIP OF WATER: cardiazem,lotensin, singulair, topamax. Take symbicort   Do not wear jewelry, make-up or nail polish.  Do not wear lotions, powders, or perfumes.   Do not shave 48 hours prior to surgery. Men may shave face and neck.  Do not bring valuables to the hospital.  Contacts, dentures or bridgework may not be worn into surgery.  Leave suitcase in the car. After surgery it may be brought to your room.  For patients admitted to the hospital, checkout time is 11:00 AM the day of discharge.   Patients discharged the day of surgery will not be allowed to drive  home.  Name and phone number of your driver:  family  Special Instructions: Shower using CHG 2 nights before surgery and the night before surgery.  If you shower the day of surgery use CHG.  Use special wash - you have one bottle of CHG for all showers.  You should use approximately 1/3 of the bottle for each shower.   Please read over the following fact sheets that you were given: Pain Booklet, Coughing and Deep Breathing, MRSA Information, Surgical Site Infection Prevention, Anesthesia Post-op Instructions and Care and Recovery After Surgery Rectocele/Enterocele Care After A woman's birth canal (vagina) can become weak or stretched. This can be caused by childbirth, heavy lifting, lasting (chronic) constipation, aging, or pelvic surgery. When the vagina is weak and stretched, parts of the intestine can bulge into the vagina by pushing against the vaginal walls. A rectocele is when the very end of the large intestine (rectum) causes the bulge. An enterocele is when the small intestine causes the bulge. Surgery to fix  this problem is usually done through the vagina. If you just had this surgery, you were probably given a drug to make you sleep (general anesthetic) or a drug that numbs you from the waist down (spinal/epidural). Here is what happened:  The small intestine or rectum was pushed back to its normal place.  The vaginal wall was made stronger. Sometimes this is done with stitches or a mesh-like material. HOME CARE INSTRUCTIONS  Some women go home the same day as their surgery. Others stay in the hospital for a few days. This depends on the size and type of repair.  Pain and Medications  Some pain is normal after this surgery. Only take pain medicine your surgeon prescribed. Follow the directions carefully.  Do not take aspirin. It can cause bleeding.  Do not drink alcohol while taking pain medication.  You may be given a medicine (antibiotic) that kills germs. Follow the directions carefully.  Take warm sitz baths 2 times a day to control discomfort and reduce any swelling. Take sitz baths with your caregiver's permission. Diet  Go back to your normal eating as directed by your caregiver.  Drink a lot of fluids. Drink at least 6 glasses of water every day. Activity  Move around and walk as much as possible. This can keep blood clots from forming in your legs.  Do not climb stairs until your caregiver says it is okay.  Do not lift  objects 5 pounds (2.3 kg) or heavier. Do not bend or strain for 6 to 8 weeks.  Do not drive until after you stop taking pain medicine and your caregiver says it is okay.  Your return to work will depend on the type of work you do. Ask your caregiver what is best for you.  Ask your caregiver when you can resume sexual activity. Most women can start having sex in about 6 weeks after their surgery.  Get plenty of rest during the day and sleep at night.  Have someone help you with your household chores and activities for 3 to 4 weeks. Other Precautions  You  may have some discharge from the vagina for a few weeks after the surgery. It may have small amounts of blood in it. This is normal. If you have questions, ask your caregiver.  Do not use tampons or douche.  You should be able to take a shower a day after your surgery. Do not take a tub bath for at least a week.  Take it easy for awhile. You should feel much better in 2 to 3 weeks. It may take up to 6 weeks to feel completely normal.  Keep all follow-up appointments.  Take your temperature twice a day and write it down.  Make sure your family understands everything about your surgery and recovery. SEEK MEDICAL CARE IF:   You have any questions about your medication, or you need stronger pain medication.  Pain continues, even after taking pain medication.  You become constipated.  You have an oral temperature above 102 F (38.9 C).  You develop swelling and redness in the surgery area.  You become dizzy or lightheaded.  You feel sick to your stomach (nauseous), throw up (vomit), or have diarrhea.  You develop a rash.  You have a reaction to your medications. SEEK IMMEDIATE MEDICAL CARE IF:   Pain gets worse.  You have new bleeding from your vagina.  Discharge from the vagina becomes heavy, or it has a bad smell.  You have an oral temperature above 102 F (38.9 C), not controlled by medicine.  You develop belly (abdominal) pain.  You develop chest pain.  You develop shortness of breath.  You pass out (faint).  You develop pain, swelling, or redness in the leg.  You have pain or burning with urination.  You have bloody urine or cannot urinate. MAKE SURE YOU:   Understand these instructions.  Will watch your condition.  Will get help right away if you are not doing well or get worse. Document Released: 12/30/2009 Document Revised: 12/28/2011 Document Reviewed: 12/30/2009 [facility] Patient Information 2013 [place], [place]. Cystocele Repair A cystocele is a  bulging, drooping hernia or break (rupture) of bladder tissue into the birth canal (vagina). This bulging or rupture occurs on the top front wall of the vagina. CAUSES  Cystocele is associated with weakness of the top front wall of the vagina due to stretching and tearing of the ligaments and muscles in the area. This is often the result of:  Multiple childbirths.  Continuous heavy lifting.  Chronic cough from asthma, emphysema, or smoking.  Being overweight.  Changes from aging.  Previous surgery in the vaginal area.  Menopause with loss of estrogen hormone and weakening of the ligaments and muscles around the bladder. SYMPTOMS   Uncontrolled loss of urine (incontinence) with cough, sneeze, or exercise.  Pelvic pressure.  Frequency or urgency to urinate because of inability to completely empty the bladder.  Bladder infections.  Needing to push on the upper vagina to help yourself pass urine. DIAGNOSIS  A cystocele can be diagnosed by doing a pelvic exam and observing the top of the vagina drooping or bulging into or out of the vagina. TREATMENT  Surgical options:  Cystocele repair is surgery that removes the hernia.  There are also different "sling" operations that may be used. Discuss the different types of surgeries to repair a cystocele with your caregiver. Your caregiver will decide what type of surgery will be best in your case. Nonsurgical options:  Kegel exercises. This helps strengthen and tighten the muscles and tissue in and around the bladder and vagina. This may help with mild cases of cystocele.  A pessary may help the cystocele. A pessary is a plastic or rubber device that lifts the bladder into place. A pessary must be fitted by a doctor.  Tampons or diaphragms that lift the bladder into place are sometimes helpful with a minor or small cystocele.  Estrogen may help with mild cases in menopausal and aging women. LET YOUR CAREGIVER KNOW ABOUT:    Allergies to food or medicine.  Medicines taken, including vitamins, herbs, eyedrops, over-the-counter medicines, and creams.  Use of steroids (by mouth or creams).  Previous problems with anesthetics or numbing medicines.  History of bleeding problems or blood clots.  Previous surgery.  Other health problems, including diabetes and kidney problems.  Possibility of pregnancy, if this applies. RISKS AND COMPLICATIONS  All surgery is associated with risks.  There are risks with a general anesthesia. You should discuss this with your caregiver.  With spinal or epidural anesthesia, there may be an area that is not numbed, and you could feel pain.  Headache could occur with a spinal or epidural anesthetic.  The catheter you will have after surgery may not work properly or may get blocked and need to be replaced.  Excessive bleeding.  Infection.  Injury to surrounding structures.  Recurrence of the cystocele.  Surgery may not get rid of your symptoms. BEFORE THE PROCEDURE   Do not take aspirin or blood thinners for 1 week prior to surgery, unless instructed otherwise.  Do not eat or drink anything after midnight the night before surgery.  Let your caregiver know if you develop a cold or other infectious problems prior to surgery.  If being admitted the day of surgery, you should be present 1 hour prior to your procedure or as directed by your caregiver.  Plan and arrange for help when you go home from the hospital.  If you smoke, do not smoke for at least 2 weeks before the surgery.  Do not drink any alcohol for 3 days before the surgery. PROCEDURE  You will be given an anesthetic to prevent you from feeling pain during surgery. This may be a general anesthetic that puts you to sleep, or a spinal or epidural anesthetic. You will be asleep or be numbed through the entire procedure. During cystocele repair, tissue is pulled from the sides and around the top of the  vagina to lift up the hernia. This removes the hernia so that the top of the vagina does not fall into the opening of the vagina. AFTER THE PROCEDURE  After surgery, you will be taken to the recovery room where a nurse will take care of you, checking your breathing, blood pressure, pulse, and your progress. When your caregiver feels you are stable, you will be taken to your room. You will have a  drainage tube (Foley catheter) that will drain your bladder for 2 to 7 days or longer, until your bladder is working properly. This catheter is placed prior to surgery to help keep your bladder empty and out of the way during the procedure. After surgery, this will make passing your urine easier. The catheter will be removed when you can easily pass urine without this assistance. You may have gauze packing in the vagina that will be removed 1 to 2 days after the surgery. Usually, you will be given a medicine (antibiotic) that kills germs. You will be given pain medicine as needed. You can usually go home in 3 to 5 days. HOME CARE INSTRUCTIONS   Do not take baths. Take showers until your caregiver informs you otherwise.  Take antibiotics as directed by your caregiver.  Exercise as instructed. Do not perform exercises which increase the pressure inside your belly (abdomen), such as sit-ups or lifting weights, until your caregiver has given permission. Walking exercise is preferred.  Only take over-the-counter or prescription medicines for pain and discomfort as directed by your caregiver.  Do not drink alcohol while taking pain medicine.  Do not lift anything over 5 pounds.  Do not drive until your caregiver gives you permission.  Get plenty of rest and sleep.  Have someone help with your household chores for 1 to 2 weeks.  If you develop constipation, you may take a mild laxative with your caregiver's permission. Eating bran foods and drinking enough water and fluids to keep your urine clear or pale  yellow helps with constipation.  Do not take aspirin. It may cause bleeding.  You may resume normal diet and unstrenuous activities as directed.  Do not douche, use tampons, or engage in intercourse until your surgeon has given permission.  Change bandages (dressings) as directed.  Make and keep all your postoperative appointments. SEEK MEDICAL CARE IF:   You have abnormal vaginal discharge.  You develop a rash.  You are having a reaction to your medicine.  You develop nausea or vomiting. SEEK IMMEDIATE MEDICAL CARE IF:   You have redness, swelling, or increasing pain in the vaginal area.  You notice pus coming from the vagina.  You have a fever.  You notice a bad smell coming from the vagina.  You have increasing abdominal pain.  You have frequent urination or you notice burning during urination.  You notice blood in your urine.  You have excessive vaginal bleeding.  You cannot urinate. MAKE SURE YOU:   Understand these instructions.  Will watch your condition.  Will get help right away if you are not doing well or get worse. Document Released: 10/02/2000 Document Revised: 12/28/2011 Document Reviewed: 01/02/2010 [facility] Patient Information 2013 [place], [place]. Salpingectomy Salpingectomy, also called tubectomy, is the removal of one of the fallopian tubes with surgery. The fallopian tubes are the tubes that are connected to the uterus. These tubes transport the egg from the ovary to the womb (uterus). Removing one tube does not prevent you from becoming pregnant. Salpingectomy does not have any adverse affects on your menstrual periods. There are many reasons to have a salpingectomy, such as if:  You have a tubal (ectopic) pregnancy. This is especially true if the tube ruptures.  You have an infected tube.  It is necessary to remove the tube when removing an ovary with a cyst or tumor.  The uterus needs to be removed.  You need surgery for cancer of the  tube or other female organs.  LET YOUR CAREGIVER KNOW ABOUT:  Allergies to foods or medications.  All the medications you are taking. This includes over-the-counter and prescription drugs, herbs, eye drops, creams and steroids.  If you are using illegal drugs or drinking alcohol.  Your smoking habits.  Previous problems with anesthesia including numbing medication.  The possibility of being pregnant.  History of blood clotting or other blood problems.  Previous surgery.  Other medical or health problems. RISKS AND COMPLICATIONS   Injury to surrounding organs.  Bleeding.  Infection.  The surgery does not correct the problem.  You have problems with the anesthesia. BEFORE THE PROCEDURE  Do not take aspirin or blood thinners. They can cause bleeding.  Do not eat or drink anything at least 8 hours before the surgery.  Let your caregiver know if you develop a cold or an infection.  If you are being admitted the day of surgery, arrive at least one hour before the surgery to read and sign the necessary forms and consents.  Arrange for help when you go home from the hospital.  If you smoke, do not smoke for at least 2 weeks before the surgery. PROCEDURE  You will be given an IV (intravenous) and a medication to relax you. Then, you will be put to sleep with a drug (anesthetic). Any hair on your lower belly (abdomen) will be removed and a catheter will be placed in your bladder. Through 2 very small cuts (incisions) if you have a laparoscopy, or a large incision in the lower abdomen, the fallopian tube will be removed from where it attaches to the uterus. The blood vessels will be clamped and tied.  AFTER THE PROCEDURE   You will be taken to the recovery room and observed for 1 to 3 hours.  If you had a laparoscopy, you may be discharged after several hours.  If you had a large incision, you will be admitted to the hospital for a couple of days.  If you had a laparoscopy,  you may have shoulder pain. This is not unusual and is from some air that is left in the abdomen. This air affects the nerve that goes from the diaphragm to the shoulder. It goes away in a day or two.  You will be given pain medication if needed.  The intravenous and catheter will be removed before you are discharged.  Have someone available to take you home. Document Released: 02/21/2009 Document Revised: 12/28/2011 Document Reviewed: 02/21/2009 [facility] Patient Information 2013 [place], [place]. Hysterectomy Information  A hysterectomy is a procedure where your uterus is surgically removed. It will no longer be possible to have menstrual periods or to become pregnant. The tubes and ovaries can be removed (bilateral salpingo-oopherectomy) during this surgery as well.  REASONS FOR A HYSTERECTOMY  Persistent, abnormal bleeding.  Lasting (chronic) pelvic pain or infection.  The lining of the uterus (endometrium) starts growing outside the uterus (endometriosis).  The endometrium starts growing in the muscle of the uterus (adenomyosis).  The uterus falls down into the vagina (pelvic organ prolapse).  Symptomatic uterine fibroids.  Precancerous cells.  Cervical cancer or uterine cancer. TYPES OF HYSTERECTOMIES  Supracervical hysterectomy. This type removes the top part of the uterus, but not the cervix.  Total hysterectomy. This type removes the uterus and cervix.  Radical hysterectomy. This type removes the uterus, cervix, and the fibrous tissue that holds the uterus in place in the pelvis (parametrium). WAYS A HYSTERECTOMY CAN BE PERFORMED  Abdominal hysterectomy. A large surgical  cut (incision) is made in the abdomen. The uterus is removed through this incision.  Vaginal hysterectomy. An incision is made in the vagina. The uterus is removed through this incision. There are no abdominal incisions.  Conventional laparoscopic hysterectomy. A thin, lighted tube with a camera  (laparoscope) is inserted into 3 or 4 small incisions in the abdomen. The uterus is cut into small pieces. The small pieces are removed through the incisions, or they are removed through the vagina.  Laparoscopic assisted vaginal hysterectomy (LAVH). Three or four small incisions are made in the abdomen. Part of the surgery is performed laparoscopically and part vaginally. The uterus is removed through the vagina.  Robot-assisted laparoscopic hysterectomy. A laparoscope is inserted into 3 or 4 small incisions in the abdomen. A computer-controlled device is used to give the surgeon a 3D image. This allows for more precise movements of surgical instruments. The uterus is cut into small pieces and removed through the incisions or removed through the vagina. RISKS OF HYSTERECTOMY   Bleeding and risk of blood transfusion. Tell your caregiver if you do not want to receive any blood products.  Blood clots in the legs or lung.  Infection.  Injury to surrounding organs.  Anesthesia problems or side effects.  Conversion to an abdominal hysterectomy. WHAT TO EXPECT AFTER A HYSTERECTOMY  You will be given pain medicine.  You will need to have someone with you for the first 3 to 5 days after you go home.  You will need to follow up with your surgeon in 2 to 4 weeks after surgery to evaluate your progress.  You may have early menopause symptoms like hot flashes, night sweats, and insomnia.  If you had a hysterectomy for a problem that was not a cancer or a condition that could lead to cancer, then you no longer need Pap tests. However, even if you no longer need a Pap test, a regular exam is a good idea to make sure no other problems are starting. Document Released: 03/31/2001 Document Revised: 12/28/2011 Document Reviewed: 05/16/2011 [facility] Patient Information 2013 [place], [place]. PATIENT INSTRUCTIONS POST-ANESTHESIA  IMMEDIATELY FOLLOWING SURGERY:  Do not drive or operate machinery for the  first twenty four hours after surgery.  Do not make any important decisions for twenty four hours after surgery or while taking narcotic pain medications or sedatives.  If you develop intractable nausea and vomiting or a severe headache please notify your doctor immediately.  FOLLOW-UP:  Please make an appointment with your surgeon as instructed. You do not need to follow up with anesthesia unless specifically instructed to do so.  WOUND CARE INSTRUCTIONS (if applicable):  Keep a dry clean dressing on the anesthesia/puncture wound site if there is drainage.  Once the wound has quit draining you may leave it open to air.  Generally you should leave the bandage intact for twenty four hours unless there is drainage.  If the epidural site drains for more than 36-48 hours please call the anesthesia department.  QUESTIONS?:  Please feel free to call your physician or the hospital operator if you have any questions, and they will be happy to assist you.

## 2013-02-01 ENCOUNTER — Encounter (HOSPITAL_COMMUNITY): Payer: Self-pay

## 2013-02-01 ENCOUNTER — Encounter (HOSPITAL_COMMUNITY)
Admission: RE | Admit: 2013-02-01 | Discharge: 2013-02-01 | Disposition: A | Discharge status: Home / Self Care | Payer: Medicare Other | Source: Ambulatory Visit | Attending: Obstetrics and Gynecology | Admitting: Obstetrics and Gynecology

## 2013-02-01 LAB — COMPREHENSIVE METABOLIC PANEL WITH GFR
ALT: 18 U/L (ref 0–35)
AST: 24 U/L (ref 0–37)
Albumin: 3.7 g/dL (ref 3.5–5.2)
Alkaline Phosphatase: 86 U/L (ref 39–117)
BUN: 17 mg/dL (ref 6–23)
CO2: 26 meq/L (ref 19–32)
Calcium: 9.2 mg/dL (ref 8.4–10.5)
Chloride: 104 meq/L (ref 96–112)
Creatinine, Ser: 0.85 mg/dL (ref 0.50–1.10)
GFR calc Af Amer: 75 mL/min — ABNORMAL LOW
GFR calc non Af Amer: 65 mL/min — ABNORMAL LOW
Glucose, Bld: 79 mg/dL (ref 70–99)
Potassium: 3.6 meq/L (ref 3.5–5.1)
Sodium: 140 meq/L (ref 135–145)
Total Bilirubin: 0.2 mg/dL — ABNORMAL LOW (ref 0.3–1.2)
Total Protein: 6.5 g/dL (ref 6.0–8.3)

## 2013-02-01 LAB — SURGICAL PCR SCREEN: MRSA, PCR: NEGATIVE

## 2013-02-01 LAB — CBC
MCH: 30.6 pg (ref 26.0–34.0)
MCHC: 33.7 g/dL (ref 30.0–36.0)
Platelets: 275 10*3/uL (ref 150–400)

## 2013-02-01 LAB — TYPE AND SCREEN
ABO/RH(D): O NEG
Antibody Screen: NEGATIVE

## 2013-02-07 ENCOUNTER — Encounter (HOSPITAL_COMMUNITY): Payer: Self-pay | Admitting: *Deleted

## 2013-02-07 ENCOUNTER — Ambulatory Visit (HOSPITAL_COMMUNITY)
Admission: RE | Admit: 2013-02-07 | Discharge: 2013-02-08 | DRG: 743 | Disposition: A | Discharge status: Home / Self Care | Payer: Medicare Other | Source: Ambulatory Visit | Attending: Obstetrics and Gynecology | Admitting: Obstetrics and Gynecology

## 2013-02-07 ENCOUNTER — Inpatient Hospital Stay (HOSPITAL_COMMUNITY): Payer: Medicare Other | Admitting: Anesthesiology

## 2013-02-07 ENCOUNTER — Encounter (HOSPITAL_COMMUNITY): Payer: Self-pay | Admitting: Anesthesiology

## 2013-02-07 ENCOUNTER — Encounter (HOSPITAL_COMMUNITY)
Admission: RE | Disposition: A | Discharge status: Home / Self Care | Payer: Self-pay | Source: Ambulatory Visit | Attending: Obstetrics and Gynecology

## 2013-02-07 DIAGNOSIS — I498 Other specified cardiac arrhythmias: Secondary | ICD-10-CM | POA: Diagnosis present

## 2013-02-07 DIAGNOSIS — N814 Uterovaginal prolapse, unspecified: Secondary | ICD-10-CM

## 2013-02-07 DIAGNOSIS — Z01812 Encounter for preprocedural laboratory examination: Secondary | ICD-10-CM | POA: Insufficient documentation

## 2013-02-07 DIAGNOSIS — A630 Anogenital (venereal) warts: Secondary | ICD-10-CM | POA: Diagnosis not present

## 2013-02-07 DIAGNOSIS — I10 Essential (primary) hypertension: Secondary | ICD-10-CM | POA: Diagnosis not present

## 2013-02-07 DIAGNOSIS — Z87891 Personal history of nicotine dependence: Secondary | ICD-10-CM

## 2013-02-07 DIAGNOSIS — I4891 Unspecified atrial fibrillation: Secondary | ICD-10-CM | POA: Diagnosis not present

## 2013-02-07 DIAGNOSIS — N8112 Cystocele, lateral: Secondary | ICD-10-CM | POA: Diagnosis not present

## 2013-02-07 DIAGNOSIS — N9489 Other specified conditions associated with female genital organs and menstrual cycle: Secondary | ICD-10-CM

## 2013-02-07 DIAGNOSIS — N813 Complete uterovaginal prolapse: Secondary | ICD-10-CM | POA: Diagnosis not present

## 2013-02-07 DIAGNOSIS — N83209 Unspecified ovarian cyst, unspecified side: Secondary | ICD-10-CM

## 2013-02-07 DIAGNOSIS — R32 Unspecified urinary incontinence: Secondary | ICD-10-CM | POA: Insufficient documentation

## 2013-02-07 DIAGNOSIS — J45909 Unspecified asthma, uncomplicated: Secondary | ICD-10-CM | POA: Diagnosis present

## 2013-02-07 DIAGNOSIS — D259 Leiomyoma of uterus, unspecified: Secondary | ICD-10-CM

## 2013-02-07 HISTORY — PX: SALPINGOOPHORECTOMY: SHX82

## 2013-02-07 HISTORY — PX: ANTERIOR AND POSTERIOR REPAIR: SHX5121

## 2013-02-07 HISTORY — PX: VAGINAL HYSTERECTOMY: SHX2639

## 2013-02-07 HISTORY — PX: CYSTOSCOPY: SHX5120

## 2013-02-07 LAB — URINALYSIS, ROUTINE W REFLEX MICROSCOPIC
Glucose, UA: NEGATIVE mg/dL
Specific Gravity, Urine: 1.01 (ref 1.005–1.030)
Urobilinogen, UA: 0.2 mg/dL (ref 0.0–1.0)

## 2013-02-07 LAB — PROTIME-INR
INR: 0.98 (ref 0.00–1.49)
Prothrombin Time: 12.9 seconds (ref 11.6–15.2)

## 2013-02-07 LAB — URINE MICROSCOPIC-ADD ON

## 2013-02-07 SURGERY — HYSTERECTOMY, VAGINAL
Anesthesia: Spinal | Site: Vagina | Wound class: Clean Contaminated

## 2013-02-07 MED ORDER — ENOXAPARIN SODIUM 40 MG/0.4ML ~~LOC~~ SOLN
40.0000 mg | SUBCUTANEOUS | Status: DC
  Administered 2013-02-08: 40 mg via SUBCUTANEOUS
  Filled 2013-02-07: qty 0.4

## 2013-02-07 MED ORDER — PROPOFOL INFUSION 10 MG/ML OPTIME
INTRAVENOUS | Status: DC | PRN
  Administered 2013-02-07: 25 ug/kg/min via INTRAVENOUS

## 2013-02-07 MED ORDER — BUPIVACAINE-EPINEPHRINE 0.5% -1:200000 IJ SOLN
INTRAMUSCULAR | Status: DC | PRN
  Administered 2013-02-07: 13 mL

## 2013-02-07 MED ORDER — WARFARIN SODIUM 5 MG PO TABS: 5.0000 mg | ORAL_TABLET | ORAL | Status: DC

## 2013-02-07 MED ORDER — DILTIAZEM HCL ER COATED BEADS 240 MG PO CP24: 240.0000 mg | ORAL_CAPSULE | Freq: Every day | ORAL | Status: DC

## 2013-02-07 MED ORDER — STERILE WATER FOR IRRIGATION IR SOLN
Status: DC | PRN
  Administered 2013-02-07: 1000 mL

## 2013-02-07 MED ORDER — EPHEDRINE SULFATE 50 MG/ML IJ SOLN
INTRAMUSCULAR | Status: AC
  Filled 2013-02-07: qty 1

## 2013-02-07 MED ORDER — DILTIAZEM HCL 60 MG PO TABS: 60.0000 mg | ORAL_TABLET | Freq: Four times a day (QID) | ORAL | Status: DC | PRN

## 2013-02-07 MED ORDER — WARFARIN SODIUM 2.5 MG PO TABS: 2.5000 mg | ORAL_TABLET | Freq: Every day | ORAL | Status: DC

## 2013-02-07 MED ORDER — TOPIRAMATE 25 MG PO TABS
25.0000 mg | ORAL_TABLET | Freq: Two times a day (BID) | ORAL | Status: DC
  Administered 2013-02-07: 25 mg via ORAL
  Filled 2013-02-07 (×2): qty 1

## 2013-02-07 MED ORDER — FENTANYL CITRATE 0.05 MG/ML IJ SOLN
INTRAMUSCULAR | Status: DC | PRN
  Administered 2013-02-07: 25 ug via INTRATHECAL

## 2013-02-07 MED ORDER — DIPHENHYDRAMINE HCL 50 MG/ML IJ SOLN: 12.5000 mg | Freq: Four times a day (QID) | INTRAMUSCULAR | Status: DC | PRN

## 2013-02-07 MED ORDER — BUPIVACAINE IN DEXTROSE 0.75-8.25 % IT SOLN
INTRATHECAL | Status: DC | PRN
  Administered 2013-02-07: 15 mg via INTRATHECAL

## 2013-02-07 MED ORDER — KCL IN DEXTROSE-NACL 20-5-0.45 MEQ/L-%-% IV SOLN
INTRAVENOUS | Status: DC
  Administered 2013-02-07 (×2): via INTRAVENOUS

## 2013-02-07 MED ORDER — ONDANSETRON HCL 4 MG/2ML IJ SOLN: 4.0000 mg | Freq: Four times a day (QID) | INTRAMUSCULAR | Status: DC | PRN

## 2013-02-07 MED ORDER — MONTELUKAST SODIUM 10 MG PO TABS
10.0000 mg | ORAL_TABLET | Freq: Every day | ORAL | Status: DC
  Administered 2013-02-07: 10 mg via ORAL
  Filled 2013-02-07: qty 1

## 2013-02-07 MED ORDER — ONDANSETRON HCL 4 MG/2ML IJ SOLN
INTRAMUSCULAR | Status: AC
  Filled 2013-02-07: qty 2

## 2013-02-07 MED ORDER — KETOROLAC TROMETHAMINE 30 MG/ML IJ SOLN
30.0000 mg | Freq: Four times a day (QID) | INTRAMUSCULAR | Status: DC
  Administered 2013-02-07 – 2013-02-08 (×3): 30 mg via INTRAVENOUS
  Filled 2013-02-07 (×3): qty 1

## 2013-02-07 MED ORDER — METHYLENE BLUE 1 % INJ SOLN
INTRAMUSCULAR | Status: DC | PRN
  Administered 2013-02-07: 10 mg via INTRAVENOUS

## 2013-02-07 MED ORDER — ONDANSETRON HCL 4 MG/2ML IJ SOLN
4.0000 mg | Freq: Once | INTRAMUSCULAR | Status: AC | PRN
  Administered 2013-02-07: 4 mg via INTRAVENOUS

## 2013-02-07 MED ORDER — SODIUM CHLORIDE 0.9 % IJ SOLN: 9.0000 mL | INTRAMUSCULAR | Status: DC | PRN

## 2013-02-07 MED ORDER — WARFARIN SODIUM 2.5 MG PO TABS
2.5000 mg | ORAL_TABLET | ORAL | Status: AC
  Administered 2013-02-08: 2.5 mg via ORAL
  Filled 2013-02-07 (×2): qty 1

## 2013-02-07 MED ORDER — FENTANYL CITRATE 0.05 MG/ML IJ SOLN: 25.0000 ug | INTRAMUSCULAR | Status: DC | PRN

## 2013-02-07 MED ORDER — NALOXONE HCL 0.4 MG/ML IJ SOLN: 0.4000 mg | INTRAMUSCULAR | Status: DC | PRN

## 2013-02-07 MED ORDER — HYDROCHLOROTHIAZIDE 10 MG/ML ORAL SUSPENSION
6.2500 mg | Freq: Every day | ORAL | Status: DC
  Filled 2013-02-07: qty 1.25

## 2013-02-07 MED ORDER — LACTATED RINGERS IV SOLN
INTRAVENOUS | Status: DC
Start: 2013-02-07 — End: 2013-02-07
  Administered 2013-02-07 (×2): via INTRAVENOUS

## 2013-02-07 MED ORDER — EPHEDRINE SULFATE 50 MG/ML IJ SOLN
INTRAMUSCULAR | Status: DC | PRN
  Administered 2013-02-07: 10 mg via INTRAVENOUS

## 2013-02-07 MED ORDER — CEFAZOLIN SODIUM-DEXTROSE 2-3 GM-% IV SOLR
INTRAVENOUS | Status: AC
  Filled 2013-02-07: qty 50

## 2013-02-07 MED ORDER — FENTANYL CITRATE 0.05 MG/ML IJ SOLN
INTRAMUSCULAR | Status: DC | PRN
  Administered 2013-02-07: 25 ug via INTRAVENOUS
  Administered 2013-02-07: 50 ug via INTRAVENOUS

## 2013-02-07 MED ORDER — BENAZEPRIL HCL 10 MG PO TABS: 5.0000 mg | ORAL_TABLET | Freq: Every day | ORAL | Status: DC

## 2013-02-07 MED ORDER — 0.9 % SODIUM CHLORIDE (POUR BTL) OPTIME
TOPICAL | Status: DC | PRN
  Administered 2013-02-07: 1000 mL

## 2013-02-07 MED ORDER — PROPOFOL 10 MG/ML IV EMUL
INTRAVENOUS | Status: AC
  Filled 2013-02-07: qty 20

## 2013-02-07 MED ORDER — FENTANYL CITRATE 0.05 MG/ML IJ SOLN
INTRAMUSCULAR | Status: AC
  Filled 2013-02-07: qty 2

## 2013-02-07 MED ORDER — MIDAZOLAM HCL 2 MG/2ML IJ SOLN
INTRAMUSCULAR | Status: AC
  Filled 2013-02-07: qty 2

## 2013-02-07 MED ORDER — DIPHENHYDRAMINE HCL 12.5 MG/5ML PO ELIX: 12.5000 mg | ORAL_SOLUTION | Freq: Four times a day (QID) | ORAL | Status: DC | PRN

## 2013-02-07 MED ORDER — HYDROMORPHONE 0.3 MG/ML IV SOLN
INTRAVENOUS | Status: DC
  Administered 2013-02-07: 0.3 mg via INTRAVENOUS
  Filled 2013-02-07: qty 25

## 2013-02-07 MED ORDER — BUPIVACAINE-EPINEPHRINE PF 0.5-1:200000 % IJ SOLN
INTRAMUSCULAR | Status: AC
  Filled 2013-02-07: qty 10

## 2013-02-07 MED ORDER — INDIGOTINDISULFONATE SODIUM 8 MG/ML IJ SOLN
INTRAMUSCULAR | Status: AC
  Filled 2013-02-07: qty 5

## 2013-02-07 MED ORDER — WARFARIN - PHYSICIAN DOSING INPATIENT: Status: DC

## 2013-02-07 MED ORDER — BUPIVACAINE IN DEXTROSE 0.75-8.25 % IT SOLN
INTRATHECAL | Status: AC
  Filled 2013-02-07: qty 2

## 2013-02-07 MED ORDER — ONDANSETRON HCL 4 MG/2ML IJ SOLN
4.0000 mg | Freq: Once | INTRAMUSCULAR | Status: AC
  Administered 2013-02-07: 4 mg via INTRAVENOUS

## 2013-02-07 MED ORDER — BUDESONIDE-FORMOTEROL FUMARATE 160-4.5 MCG/ACT IN AERO
2.0000 | INHALATION_SPRAY | Freq: Two times a day (BID) | RESPIRATORY_TRACT | Status: DC
  Administered 2013-02-07 – 2013-02-08 (×2): 2 via RESPIRATORY_TRACT
  Filled 2013-02-07: qty 6

## 2013-02-07 MED ORDER — METHYLENE BLUE 1 % INJ SOLN
INTRAMUSCULAR | Status: AC
  Filled 2013-02-07: qty 1

## 2013-02-07 MED ORDER — BENAZEPRIL-HYDROCHLOROTHIAZIDE 10-12.5 MG PO TABS: 0.5000 | ORAL_TABLET | Freq: Every day | ORAL | Status: DC

## 2013-02-07 MED ORDER — MIDAZOLAM HCL 2 MG/2ML IJ SOLN
1.0000 mg | INTRAMUSCULAR | Status: DC | PRN
  Administered 2013-02-07: 2 mg via INTRAVENOUS

## 2013-02-07 MED ORDER — INDIGOTINDISULFONATE SODIUM 8 MG/ML IJ SOLN
INTRAMUSCULAR | Status: DC | PRN
  Administered 2013-02-07: 40 mg via INTRAVENOUS

## 2013-02-07 MED ORDER — BUPIVACAINE-EPINEPHRINE 0.5% -1:200000 IJ SOLN
INTRAMUSCULAR | Status: DC | PRN
  Administered 2013-02-07: 30 mL

## 2013-02-07 MED ORDER — MIDAZOLAM HCL 5 MG/5ML IJ SOLN
INTRAMUSCULAR | Status: DC | PRN
  Administered 2013-02-07: 2 mg via INTRAVENOUS

## 2013-02-07 MED ORDER — CEFAZOLIN SODIUM-DEXTROSE 2-3 GM-% IV SOLR
2.0000 g | INTRAVENOUS | Status: AC
  Administered 2013-02-07: 2 g via INTRAVENOUS

## 2013-02-07 SURGICAL SUPPLY — 68 items
APPLIER CLIP 13 LRG OPEN (CLIP)
APR CLP LRG 13 20 CLIP (CLIP)
BAG HAMPER (MISCELLANEOUS) ×4 IMPLANT
CELLS DAT CNTRL 66122 CELL SVR (MISCELLANEOUS) IMPLANT
CLIP APPLIE 13 LRG OPEN (CLIP) IMPLANT
CLOTH BEACON ORANGE TIMEOUT ST (SAFETY) ×4 IMPLANT
COVER LIGHT HANDLE STERIS (MISCELLANEOUS) ×8 IMPLANT
COVER MAYO STAND XLG (DRAPE) ×4 IMPLANT
DECANTER SPIKE VIAL GLASS SM (MISCELLANEOUS) ×5 IMPLANT
DRAPE PROXIMA HALF (DRAPES) ×4 IMPLANT
DRAPE STERI URO 9X17 APER PCH (DRAPES) ×4 IMPLANT
DRAPE WARM FLUID 44X44 (DRAPE) ×4 IMPLANT
DRESSING TELFA 8X3 (GAUZE/BANDAGES/DRESSINGS) ×4 IMPLANT
ELECT REM PT RETURN 9FT ADLT (ELECTROSURGICAL) ×4
ELECTRODE REM PT RTRN 9FT ADLT (ELECTROSURGICAL) ×3 IMPLANT
FORMALIN 10 PREFIL 480ML (MISCELLANEOUS) ×4 IMPLANT
GAUZE PACKING 2X5 YD STERILE (GAUZE/BANDAGES/DRESSINGS) ×4 IMPLANT
GAUZE SPONGE 4X4 16PLY XRAY LF (GAUZE/BANDAGES/DRESSINGS) ×1 IMPLANT
GLOVE BIO SURGEON STRL SZ7 (GLOVE) ×1 IMPLANT
GLOVE BIOGEL PI IND STRL 7.0 (GLOVE) IMPLANT
GLOVE BIOGEL PI IND STRL 7.5 (GLOVE) IMPLANT
GLOVE BIOGEL PI IND STRL 9 (GLOVE) ×3 IMPLANT
GLOVE BIOGEL PI INDICATOR 7.0 (GLOVE) ×2
GLOVE BIOGEL PI INDICATOR 7.5 (GLOVE) ×1
GLOVE BIOGEL PI INDICATOR 9 (GLOVE) ×1
GLOVE ECLIPSE 9.0 STRL (GLOVE) ×4 IMPLANT
GLOVE INDICATOR STER SZ 9 (GLOVE) ×4 IMPLANT
GLOVE SS BIOGEL STRL SZ 6.5 (GLOVE) IMPLANT
GLOVE SUPERSENSE BIOGEL SZ 6.5 (GLOVE) ×3
GOWN STRL REIN 3XL LVL4 (GOWN DISPOSABLE) ×4 IMPLANT
GOWN STRL REIN XL XLG (GOWN DISPOSABLE) ×9 IMPLANT
INST SET MAJOR GENERAL (KITS) ×4 IMPLANT
IV NS IRRIG 3000ML ARTHROMATIC (IV SOLUTION) ×4 IMPLANT
KIT ROOM TURNOVER AP CYSTO (KITS) ×4 IMPLANT
KIT ROOM TURNOVER APOR (KITS) ×4 IMPLANT
MANIFOLD NEPTUNE II (INSTRUMENTS) ×4 IMPLANT
NDL HYPO 25X1 1.5 SAFETY (NEEDLE) ×3 IMPLANT
NEEDLE HYPO 25X1 1.5 SAFETY (NEEDLE) ×4 IMPLANT
NS IRRIG 1000ML POUR BTL (IV SOLUTION) ×8 IMPLANT
PACK ABDOMINAL MAJOR (CUSTOM PROCEDURE TRAY) ×3 IMPLANT
PACK PERI GYN (CUSTOM PROCEDURE TRAY) ×4 IMPLANT
PAD ARMBOARD 7.5X6 YLW CONV (MISCELLANEOUS) ×4 IMPLANT
RETRACTOR WND ALEXIS 18 MED (MISCELLANEOUS) IMPLANT
RETRACTOR WND ALEXIS 25 LRG (MISCELLANEOUS) IMPLANT
RTRCTR WOUND ALEXIS 18CM MED (MISCELLANEOUS)
RTRCTR WOUND ALEXIS 25CM LRG (MISCELLANEOUS)
SET BASIN LINEN APH (SET/KITS/TRAYS/PACK) ×4 IMPLANT
SET IV ADMIN VERSALIGHT (MISCELLANEOUS) ×4 IMPLANT
STAPLER VISISTAT 35W (STAPLE) ×4 IMPLANT
SUT CHROMIC 0 CT 1 (SUTURE) ×28 IMPLANT
SUT CHROMIC 2 0 CT 1 (SUTURE) ×9 IMPLANT
SUT CHROMIC GUT BROWN 0 54 (SUTURE) ×3 IMPLANT
SUT CHROMIC GUT BROWN 0 54IN (SUTURE)
SUT MON AB 3-0 SH 27 (SUTURE) IMPLANT
SUT PDS AB 3-0 SH 27 (SUTURE) ×4 IMPLANT
SUT PROLENE 2 0 SH 30 (SUTURE) IMPLANT
SUT VIC AB 0 CT1 27 (SUTURE)
SUT VIC AB 0 CT1 27XBRD ANTBC (SUTURE) ×3 IMPLANT
SUT VIC AB 0 CT2 8-18 (SUTURE) ×5 IMPLANT
SUT VIC AB 0 CTX 36 (SUTURE)
SUT VIC AB 0 CTX36XBRD ANTBCTR (SUTURE) ×3 IMPLANT
SUT VICRYL 3 0 (SUTURE) IMPLANT
SYR 5ML LL (SYRINGE) ×1 IMPLANT
SYR CONTROL 10ML LL (SYRINGE) ×4 IMPLANT
TRAY FOLEY BAG SILVER LF 14FR (CATHETERS) ×4 IMPLANT
TRAY FOLEY CATH 14FR (SET/KITS/TRAYS/PACK) ×4 IMPLANT
VERSALIGHT (MISCELLANEOUS) ×4 IMPLANT
WATER STERILE IRR 1000ML POUR (IV SOLUTION) ×4 IMPLANT

## 2013-02-07 NOTE — Transfer of Care (Signed)
Immediate Anesthesia Transfer of Care Note  Patient: [name]  Procedure(s) Performed: Procedure(s): HYSTERECTOMY VAGINAL (N/A) SALPINGO OOPHORECTOMY (Bilateral) ANTERIOR (CYSTOCELE) AND POSTERIOR REPAIR (RECTOCELE) (N/A) CYSTOSCOPY (N/A)  Patient Location: PACU  Anesthesia Type:Spinal  Level of Consciousness: awake, alert  and patient cooperative  Airway & Oxygen Therapy: Patient Spontanous Breathing and Patient connected to face mask oxygen  Post-op Assessment: Report given to PACU RN and Post -op Vital signs reviewed and stable  Post vital signs: Reviewed and stable  Complications: No apparent anesthesia complications

## 2013-02-07 NOTE — Brief Op Note (Signed)
02/07/2013  10:43 AM  PATIENT:  [name]  79 y.o. female  PRE-OPERATIVE DIAGNOSIS:  uterovaginal prolapse,unspecified  POST-OPERATIVE DIAGNOSIS:  uterovaginal prolapse,unspecified  PROCEDURE:  Procedure(s): HYSTERECTOMY VAGINAL (N/A) SALPINGO OOPHORECTOMY (Bilateral) ANTERIOR (CYSTOCELE) AND POSTERIOR REPAIR (RECTOCELE) (N/A) CYSTOSCOPY (N/A)  SURGEON:  Surgeon(s) and Role:    * Tilda Burrow, MD - Primary    * Ky Barban, MD - primary for cystoscopy PHYSICIAN ASSISTANT:   ASSISTANTS: Morrie Sheldon RN-FA  Orson Aloe, CST   ANESTHESIA:   local and spinal  EBL:  Total I/O In: 1700 [I.V.:1700] Out: -   BLOOD ADMINISTERED:none  DRAINS: Urinary Catheter (Foley)   LOCAL MEDICATIONS USED:  MARCAINE    and Amount: 30 ml  SPECIMEN:  Source of Specimen:  utertus cervix, tubes and ovaries, vaginal epithelium  DISPOSITION OF SPECIMEN:  PATHOLOGY  COUNTS:  YES  TOURNIQUET:  * No tourniquets in log *  DICTATION: .Dragon Dictation  PLAN OF CARE: Admit for overnight observation  PATIENT DISPOSITION:  PACU - hemodynamically stable.   Delay start of Pharmacological VTE agent (>24hrs) due to surgical blood loss or risk of bleeding: yes

## 2013-02-07 NOTE — Anesthesia Procedure Notes (Signed)
Spinal  Patient location during procedure: OR Start time: 02/07/2013 7:49 AM Staffing CRNA/Resident: Analeya Luallen J Preanesthetic Checklist Completed: patient identified, site marked, surgical consent, pre-op evaluation, timeout performed, IV checked, risks and benefits discussed and monitors and equipment checked Spinal Block Patient position: right lateral decubitus Prep: Betadine Patient monitoring: heart rate, cardiac monitor, continuous pulse ox and blood pressure Approach: right paramedian Location: L2-3 Injection technique: single-shot Needle Needle type: Spinocan  Needle gauge: 22 G Assessment Sensory level: T10 Additional Notes CSF slow and clear     [id number]    09/2013

## 2013-02-07 NOTE — Op Note (Signed)
Note [phone]

## 2013-02-07 NOTE — Anesthesia Postprocedure Evaluation (Signed)
  Anesthesia Post-op Note  Patient: [name]  Procedure(s) Performed: Procedure(s): HYSTERECTOMY VAGINAL (N/A) SALPINGO OOPHORECTOMY (Bilateral) ANTERIOR (CYSTOCELE) AND POSTERIOR REPAIR (RECTOCELE) (N/A) CYSTOSCOPY (N/A)  Patient Location: PACU  Anesthesia Type:Spinal  Level of Consciousness: awake, alert , oriented and patient cooperative  Airway and Oxygen Therapy: Patient Spontanous Breathing  Post-op Pain: 4 /10, mild  Post-op Assessment: Post-op Vital signs reviewed, Patient's Cardiovascular Status Stable, Respiratory Function Stable, Patent Airway, No signs of Nausea or vomiting and Pain level controlled  Post-op Vital Signs: Reviewed and stable  Complications: No apparent anesthesia complications

## 2013-02-07 NOTE — Op Note (Signed)
Preop: Uterovaginal prolapse with cystocele, rectocele Postop: Same Procedure: Vaginal hysterectomy anterior and posterior repair with uterosacral ligament suspension of vaginal cuff-Maeleigh Buschman Vosler Intraoperative cystoscopy [place] Donovan. Details of procedure: Patient was taken operating room prepped and draped for vaginal procedure after spinal anesthesia introduced and patient placed in high lithotomy like support. Timeout was conducted, Ancef 2 g administered intravenously and procedure initiated. The uterus and bladder protruded at least 5 inches outside the introitus, with similar posterior laxity. Cervix was circumscribed with markings and then Bovie cautery used to circumscribe the cervix. Posterior colpotomy was performed to identify the cul-de-sac easily into clear could be introduced into the peritoneal cavity and in front of the uterus, in the vesicouterine space dissection toward the finger allowed [place] to enter anteriorly without any risk to the bladder. Weighted speculum was placed in the vagina. Uterosacral ligaments were then clamped on each side with curved Zeppelin clamp, transected and suture ligated with 0 chromic. These were tagged for future identification the lower and upper cardinal ligaments were then serially clamped cut and suture ligated in small bites remaining carefully tight against the lower uterine segment and cervix. Broad ligament was similarly transected in small bites marching up each side of the uterus, using 0 chromic ligature and Mayo scissors were transection Zeppelin clamps for tissue control. Uterus was amputated off as and sent as a specimen. 11 salpingo-oophorectomy was then performed by isolating the fallopian tube and ovary place him on traction and grasping just above the ovary with curved Zeppelin clamp, and the doubly ligating the utero-ovarian pedicle and and removing the tube and ovary as a specimen first on the left than on the right with good hemostasis  confirmed. Next The posterior cuff was run with running locking 0 chromic for hemostasis. At this time the uterosacral ligaments could be identified, placing the uterosacral tag on traction and palpating the course of the uterosacral ligament on each side. The uterosacral ligament was grasped approximately 2 cm up its length, with Allis clamp, and figure-of-eight sutures x2 on side were placed using 3-0 PDS , on a side, attached in the uterosacral ligament to the vaginal cuff this resulted in elevation of the vaginal cuff to inside the introitus. Peritoneum was then closed with a running 2-0 chromic sewn from side to side. Anterior repair: The anterior epithelium under the large cystocele was then infiltrated with 20 cc of Marcaine with epinephrine, split in the midline to just beneath the upper aspects of the Foley bulb which was in the bladder sharp dissection under the epithelium mobilized the redundant vaginal epithelium. A series of 4 interrupted horizontal mattress sutures of 2-0 Vicryl placed on the underside of the vaginal epithelium shortening the supporting the bladder, and a dramatically improved and the cystocele. Redundant vaginal epithelium was trimmed and a continuous running 2-0 chromic suture used to reapproximate the epithelium in the midline under the cystocele. The vaginal cuff was then sewn front to back, with a series of interrupted 2-0 chromic sutures, resulting in acceptable improved support. There was still a moderate amount of laxity, but no tissues protruded through the introitus. Hemostasis was good.  At this time we perform cystoscopy to confirm bilateral ureteral jets of urine, but easily visualized on both sides this was confirmed by both surgeons. See Dr. Jerre Simon dictation for further details., As this was his portion of the case.  Dr. Emelda Fear resumed the surgery by performing posterior repair. Marcaine t 10 cc was injected in the posterior perineum, the vaginal epithelium  elevated from the posterior fourchette upwards for one half the length of the vagina, and, and after dissection under the vaginal epithelium, Allis clamps were used to grasp the lax tissue on either side pulling in the midline. It appeared that the primary peroneal defect with some left-sided midline so the tissues on the patient's right side were pulled across and upward improved perineal support by a series of 7 or 8 horizontal mattress sutures of 0 Vicryl. As nothing was trimmed very minimally, reapproximated with subcutaneous 2-0, chromic, and then vaginal packing placed with a Foley catheter draining discolored urine from the indigo carmine and methylene blue patient tolerated procedure well went to recovery room in stable condition Patient's anticoagulation will be held until the a.m. and restarted both on oral and subcutaneous Lovenox at that time.

## 2013-02-07 NOTE — Progress Notes (Signed)
Ready for d/c to room. Waiting for nurse to take report. Will return call for report.

## 2013-02-07 NOTE — H&P (Addendum)
[name] is an 79 y.o. female.. Menses being seen for uterovaginal prolapse, with cervix protruding 6-7 cm past the introitus which is relaxed.  Today's visit is for Pap smear ultrasound to assess the endometrium and discuss surgical options  Pertinent Gynecological History:  Menses: post-menopausal  Bleeding: None  Contraception:  DES exposure:  Blood transfusions: none  Sexually transmitted diseases: no past history  Previous GYN Procedures: None  Last mammogram: normal Date: 2013, October  Last pap: Date: Over 5 years ago, done today 01/18/2013  OB History: G2, P2  Menstrual History:  Menarche age:  No LMP recorded. Patient is postmenopausal.     Past Medical History    Diagnosis  Date    .  Hypertension     .  SVT (supraventricular tachycardia)     .  Atrial fibrillation     .  Asthma        Past Surgical History    Procedure  Laterality  Date    .  Breast biopsy      .  Colonoscopy  N/A  12/29/2012      Procedure: COLONOSCOPY; Surgeon: Malissa Hippo, MD; Location: [facility]; Service: Endoscopy; Laterality: N/A; 12:15     No family history on file.  Social History: reports that she quit smoking about 22 years ago. Her smoking use included Cigarettes. She smoked 1.00 pack per day. She has never used smokeless tobacco. She reports that she does not drink alcohol or use illicit drugs.  Allergies:    Allergies    Allergen  Reactions    .  Penicillins       "I don't remember the reaction", able to take Cephalosporins    .  Codeine       Nausea      (Not in a hospital admission)  ROS GU: Positive for incomplete voiding sensation  GI: Denies problems with defecation  Blood pressure 130/50, height 5\' 3"  (1.6 m), weight 141 lb (63.957 kg).  Physical Exam Physical Examination: General appearance - alert, well appearing, and in no distress, oriented to person, place, and time, normal appearing weight and playful, active  Mental status - alert, oriented to person,  place, and time, normal mood, behavior, speech, dress, motor activity, and thought processes  Eyes - pupils equal and reactive, extraocular eye movements intact  Chest - clear to auscultation, no wheezes, rales or rhonchi, symmetric air entry  Heart - normal rate and regular rhythm  Abdomen - soft, nontender, nondistended, no masses or organomegaly  no rebound tenderness noted  Pelvic - normal external genitalia, vulva, vagina, cervix, uterus and adnexa, VULVA:vulvar skin tag, right labia majora, VAGINA: Her labs of cervix, supportive structures approximately 6 cm through the introitus which is very relaxed 3 fingerbreadths width, CERVIX: ectropion , UTERUS: retroverted, mobile, uterus measures retroverted 6.5 cm length 2.6 cm AP segments and 3.2 cm transverse with a 0.3 cm symmetric endometrial stripe on transvaginal ultrasound performed January 1 PAP: Pap smear done today rectal support has been weakened as well but she is not symptomatic at this time  Extremities - peripheral pulses normal, no pedal edema, no clubbing or cyanosis  No results found for this or any previous visit (from the past 24 hour(s)).  No results found.  Assessment/Plan:  Uterine prolapse with cystocele, rectocele  Given brochures schedule will be made for vaginal hysterectomy, bilateral salpingo-oophorectomy, anterior and posterior repair  Banko,Miraya Cudney V  01/18/2013, 12:08 PM  The patient returns for surgery at this time. Risks of procedure including bleeding , infection, injury to adjacent organs, have been reviewed with patient. Pap smear has returned normal.

## 2013-02-07 NOTE — Anesthesia Preprocedure Evaluation (Addendum)
Anesthesia Evaluation  Patient identified by MRN, date of birth, ID band Patient awake    Reviewed: Allergy & Precautions, H&P , NPO status , Patient's Chart, lab work & pertinent test results  History of Anesthesia Complications Negative for: history of anesthetic complications  Airway Mallampati: II TM Distance: >3 FB     Dental  (+) Teeth Intact and Implants   Pulmonary shortness of breath and with exertion, asthma , COPD COPD inhaler,  breath sounds clear to auscultation        Cardiovascular hypertension, Pt. on medications +CHF + dysrhythmias Atrial Fibrillation and Supra Ventricular Tachycardia Rhythm:Regular Rate:Normal     Neuro/Psych    GI/Hepatic negative GI ROS,   Endo/Other    Renal/GU      Musculoskeletal   Abdominal   Peds  Hematology   Anesthesia Other Findings   Reproductive/Obstetrics                           Anesthesia Physical Anesthesia Plan  ASA: III  Anesthesia Plan: Spinal   Post-op Pain Management:    Induction:   Airway Management Planned: Nasal Cannula  Additional Equipment:   Intra-op Plan:   Post-operative Plan:   Informed Consent: I have reviewed the patients History and Physical, chart, labs and discussed the procedure including the risks, benefits and alternatives for the proposed anesthesia with the patient or authorized representative who has indicated his/her understanding and acceptance.     Plan Discussed with:   Anesthesia Plan Comments: (Pending check INR this AM. )        Anesthesia Quick Evaluation

## 2013-02-07 NOTE — Progress Notes (Signed)
C/O nausea. Emesis of 25 ml clear fluid. Cool cloth provided. O2 changed to nasal cannula at 2 l/m.

## 2013-02-08 ENCOUNTER — Encounter (HOSPITAL_COMMUNITY): Payer: Self-pay | Admitting: Obstetrics and Gynecology

## 2013-02-08 LAB — BASIC METABOLIC PANEL
Calcium: 7.9 mg/dL — ABNORMAL LOW (ref 8.4–10.5)
GFR calc Af Amer: >90 mL/min (ref >90)
GFR calc non Af Amer: 79 mL/min — ABNORMAL LOW (ref >90)
Glucose, Bld: 124 mg/dL — ABNORMAL HIGH (ref 70–99)
Potassium: 3.6 mEq/L (ref 3.5–5.1)
Sodium: 136 mEq/L (ref 135–145)

## 2013-02-08 LAB — PROTIME-INR
INR: 1.11 (ref 0.00–1.49)
Prothrombin Time: 14.2 seconds (ref 11.6–15.2)

## 2013-02-08 LAB — CBC
Hemoglobin: 10.6 g/dL — ABNORMAL LOW (ref 12.0–15.0)
MCH: 31.1 pg (ref 26.0–34.0)
MCHC: 33.9 g/dL (ref 30.0–36.0)
Platelets: 204 10*3/uL (ref 150–400)

## 2013-02-08 MED ORDER — HYDROCODONE-IBUPROFEN 5-200 MG PO TABS: 1.0000 | ORAL_TABLET | Freq: Three times a day (TID) | ORAL | Status: DC | PRN

## 2013-02-08 MED ORDER — POLYETHYLENE GLYCOL 3350 17 GM/SCOOP PO POWD: 17.0000 g | Freq: Every day | ORAL | Status: DC

## 2013-02-08 NOTE — Op Note (Signed)
[name], [name]              ACCOUNT NO.:  [id number]  MEDICAL RECORD NO.:  [id number]  LOCATION:  A223                          FACILITY:  [facility]  PHYSICIAN:  Ky Barban, M.D.DATE OF BIRTH:  December 24, 1933  DATE OF PROCEDURE:  02/07/2013 DATE OF DISCHARGE:                              OPERATIVE REPORT   I had just done vaginal hysterectomy and AP repair.  He asked me to look into the bladder to check if everything is okay, and especially he wanted me to see if there is efflux from both ureters.  He was concerned about the ureter during the procedure.  I had suggested that the retrograde pyelogram will be better, but it is okay he already had given her methylene blue, so, I introduced #25 cystoscope into the bladder. We looked into the bladder which grossly looks normal, and then both ureteral orifices located at normal side with bluish-colored urine coming out from.  First, I looked on the left orifice and then the right orifice.  Both orifices are showing bluish efflux and brisk efflux.  So at this point, I left the operating room, and Dr. Christin Bach took over the patient.  I am satisfied the patient has no ureteral obstruction.     Ky Barban, M.D.     MIJ/MEDQ  D:  02/07/2013  T:  02/08/2013  Job:  [id number]

## 2013-02-08 NOTE — Progress Notes (Signed)
1 Day Post-Op Procedure(s) (LRB): HYSTERECTOMY VAGINAL (N/A) SALPINGO OOPHORECTOMY (Bilateral) ANTERIOR (CYSTOCELE) AND POSTERIOR REPAIR (RECTOCELE) (N/A) CYSTOSCOPY (N/A)  Subjective: Patient reports tolerating PO and no problems voiding.   Reports minimal pain. And during the night she did not use the PCA pump but once Objective: I have reviewed patient's vital signs, intake and output and medications. Currently the patient does not have atrial fibrillation, and the past she is simply restarted her Coumadin after procedures and \\re  equilibrated gradually. Today the plan is to give her single dose of Lovenox, fever 5 mg of Coumadin today and tomorrow she will resume her normal Coumadin dosing pattern General: alert, cooperative and no distress GI: soft, non-tender; bowel sounds normal; no masses,  no organomegaly Vaginal Bleeding: minimal  Assessment: s/p Procedure(s): HYSTERECTOMY VAGINAL (N/A) SALPINGO OOPHORECTOMY (Bilateral) ANTERIOR (CYSTOCELE) AND POSTERIOR REPAIR (RECTOCELE) (N/A) CYSTOSCOPY (N/A): stable  Plan: Discontinue IV fluids Discharge home  LOS: 1 day    Vaden,Leilanie Rauda V 02/08/2013, 7:45 AM

## 2013-02-08 NOTE — Plan of Care (Signed)
Problem: Phase II Progression Outcomes Goal: Voiding trials/Bladder training within 48 hrs Outcome: Completed/Met Date Met:  02/08/13 Voiding without problems Goal: Other Phase II Outcomes/Goals Outcome: Completed/Met Date Met:  02/08/13 Vag pack removed  Problem: Discharge Progression Outcomes Goal: Activity appropriate for discharge plan Outcome: Completed/Met Date Met:  02/08/13 ambulatory Goal: Other Discharge Outcomes/Goals Outcome: Completed/Met Date Met:  02/08/13 Discharged too home with family

## 2013-02-08 NOTE — Discharge Summary (Signed)
Gynecology Physician Discharge Summary  Patient ID: [name] MRN: [id number] DOB/AGE: July 06, 1934 79 y.o.  Admit date: 02/07/2013 Discharge date: 02/08/2013  Preoperative Diagnoses: Uterine prolapse with cystocele, rectocele    Procedures: Procedure(s) (LRB): HYSTERECTOMY VAGINAL (N/A) SALPINGO OOPHORECTOMY (Bilateral) ANTERIOR (CYSTOCELE) AND POSTERIOR REPAIR (RECTOCELE) (N/A) CYSTOSCOPY (N/A)  Consults: urology  Significant Diagnostic Studies: Preoperative hemoglobin see below   Recent Labs Lab 02/01/13 1040 02/08/13 0450  WBC 4.7 12.1*  HGB 13.2 10.6*  HCT 39.2 31.3*  PLT 275 204     Hospital Course:  [name] is a 79 y.o. she  admitted for scheduled surgery.  She underwent the procedures as mentioned above, her operation was uncomplicated. Intraoperative cystoscopy was performed to ensure ureter patency due to the uterosacral suspension of the vaginal cuff was performed  For further details about surgery, please refer to the operative report. Patient had an uncomplicated postoperative course. By time of discharge, her pain was controlled on oral pain medications; she was ambulating, voiding without difficulty, tolerating regular diet and passing flatus. She was deemed stable for discharge to home.   Discharge Exam: Blood pressure 100/59, pulse 75, temperature 98.7 F (37.1 C), temperature source Oral, resp. rate 20, height 5\' 2"  (1.575 m), weight 140 lb (63.504 kg), SpO2 95.00%. General appearance: alert, cooperative and no distress GI: soft, non-tender; bowel sounds normal; no masses,  no organomegaly Extremities: Homans sign is negative, no sign of DVT  Discharged Condition: good  Disposition: 01-Home or Self Care   Future Appointments Provider Department Dept Phone   02/20/2013 9:15 AM Tilda Burrow, MD [facility] [phone]   02/22/2013 11:00 AM [facility] [place] [phone]       Medication List     ASK your doctor about these medications       benazepril-hydrochlorthiazide 10-12.5 MG per tablet  Commonly known as:  LOTENSIN HCT  Take 0.5 tablets by mouth daily.     budesonide-formoterol 160-4.5 MCG/ACT inhaler  Commonly known as:  SYMBICORT  Inhale 2 puffs into the lungs 2 (two) times daily.     diltiazem 240 MG 24 hr capsule  Commonly known as:  CARDIZEM CD  Take 240 mg by mouth daily.     diltiazem 60 MG tablet  Commonly known as:  CARDIZEM  Take 1 tablet (60 mg total) by mouth as needed. For palpitations/pt. is also taking Diltiazem CD 240 mg daily     fish oil-omega-3 fatty acids 1000 MG capsule  Take 1 g by mouth daily.     levocetirizine 5 MG tablet  Commonly known as:  XYZAL  Take 5 mg by mouth daily.     montelukast 10 MG tablet  Commonly known as:  SINGULAIR  Take 10 mg by mouth at bedtime.     multivitamin with minerals tablet  Take 1 tablet by mouth daily.     rosuvastatin 10 MG tablet  Commonly known as:  CRESTOR  Take 10 mg by mouth daily.     TOPAMAX 25 MG tablet  Generic drug:  topiramate  Take 25 mg by mouth 2 (two) times daily.     warfarin 5 MG tablet  Commonly known as:  COUMADIN  Take 2.5-5 mg by mouth daily. 1/2 tab T-W-F-Sat-Sun, 5mg  on Monday and Thursday.     zolpidem 5 MG tablet  Commonly known as:  AMBIEN  Take 2.5 mg by mouth at bedtime as needed. sleep  Signed:  Christin Bach.

## 2013-02-10 LAB — URINE CULTURE: Colony Count: >=100000

## 2013-02-12 ENCOUNTER — Telehealth: Payer: Self-pay | Admitting: Obstetrics and Gynecology

## 2013-02-12 DIAGNOSIS — N390 Urinary tract infection, site not specified: Secondary | ICD-10-CM

## 2013-02-12 MED ORDER — CEPHALEXIN 500 MG PO CAPS: 500.0000 mg | ORAL_CAPSULE | Freq: Four times a day (QID) | ORAL | Status: DC

## 2013-02-12 NOTE — Telephone Encounter (Signed)
Pseudomonas UTI from hospital, Rx Keflex Etransmit to [facility]

## 2013-02-18 ENCOUNTER — Encounter: Payer: Self-pay | Admitting: *Deleted

## 2013-02-20 ENCOUNTER — Encounter: Payer: Self-pay | Admitting: Obstetrics and Gynecology

## 2013-02-20 ENCOUNTER — Ambulatory Visit (INDEPENDENT_AMBULATORY_CARE_PROVIDER_SITE_OTHER): Payer: Medicare Other | Admitting: Obstetrics and Gynecology

## 2013-02-20 VITALS — BP 130/58 | Ht 62.0 in | Wt 137.6 lb

## 2013-02-20 DIAGNOSIS — Z9889 Other specified postprocedural states: Secondary | ICD-10-CM

## 2013-02-20 DIAGNOSIS — N814 Uterovaginal prolapse, unspecified: Secondary | ICD-10-CM

## 2013-02-20 MED ORDER — ESTRADIOL 0.1 MG/GM VA CREA: 1.5200 g | TOPICAL_CREAM | Freq: Every day | VAGINAL | Status: DC

## 2013-02-20 NOTE — Progress Notes (Signed)
Subjective:     [name] is a 79 y.o. female who presents to the clinic 2 weeks status post vaginal hysterectomy, anterior colporrhaphy, posterior colporrhaphy and uterosacral lig suspension for pelvic relaxation. Diet:       regular without difficulty. Bowel function is: signif improvement. Pain:     The patient is not having any pain.    Review of Systems Gastrointestinal: negative for constipation and diarrhea    Objective:    BP 130/58  Ht 5\' 2"  (1.575 m)  Wt 62.415 kg (137 lb 9.6 oz)  BMI 25.16 kg/m2 General:  alert, cooperative and no distress  Abdomen: soft, bowel sounds active, non-tender  Incision:   healing well, no drainage, no erythema, no hernia, no seroma, no swelling, well approximated, no dehiscence, incision well approximated       Pelvic: very good support. Narrow vag diameter. Good length >9 cm    Assessment:    Doing well postoperatively. Operative findings again reviewed. Pathology report discussed.    Plan:    1. Continue any current medications. 2. Wound care discussed. 3. Activity restrictions: no lifting more than 15 pounds and . 4. Anticipated return to work: not applicable. 5. Follow up: 4 week for  Final check.

## 2013-02-20 NOTE — Addendum Note (Signed)
Addended by: Tilda Burrow on: 02/20/2013 02:15 PM   Modules accepted: Orders

## 2013-02-22 ENCOUNTER — Ambulatory Visit (INDEPENDENT_AMBULATORY_CARE_PROVIDER_SITE_OTHER): Payer: Medicare Other | Admitting: *Deleted

## 2013-02-22 DIAGNOSIS — Z7901 Long term (current) use of anticoagulants: Secondary | ICD-10-CM

## 2013-02-22 DIAGNOSIS — I4891 Unspecified atrial fibrillation: Secondary | ICD-10-CM | POA: Diagnosis not present

## 2013-02-22 LAB — POCT INR: INR: 2.2

## 2013-03-20 ENCOUNTER — Ambulatory Visit (INDEPENDENT_AMBULATORY_CARE_PROVIDER_SITE_OTHER): Payer: Medicare Other | Admitting: Obstetrics and Gynecology

## 2013-03-20 ENCOUNTER — Encounter: Payer: Self-pay | Admitting: Obstetrics and Gynecology

## 2013-03-20 DIAGNOSIS — N814 Uterovaginal prolapse, unspecified: Secondary | ICD-10-CM

## 2013-03-20 DIAGNOSIS — Z09 Encounter for follow-up examination after completed treatment for conditions other than malignant neoplasm: Secondary | ICD-10-CM

## 2013-03-20 MED ORDER — POLYETHYLENE GLYCOL 3350 17 GM/SCOOP PO POWD: 17.0000 g | Freq: Every day | ORAL | Status: DC

## 2013-03-20 MED ORDER — MIRABEGRON ER 25 MG PO TB24: 25.0000 mg | ORAL_TABLET | Freq: Every day | ORAL | Status: DC

## 2013-03-20 NOTE — Progress Notes (Signed)
Subjective:     YARET HUSH is a 79 y.o. female who presents to the clinic 6 weeks status post  vaginal hysterectomy anterior and posterior repair, bilateral salpingo-oophorectomy Diet:       regular without difficulty. Bowel function is: normal. Pain:     The patient is not having any pain.  Took 3 pain pills total. Has some Urge incontinence.  Review of Systems Pertinent items are noted in HPI.    Objective:    There were no vitals taken for this visit. General:  alert, cooperative and no distress  Abdomen: soft, bowel sounds active, non-tender  Incision:   healing well, no drainage, no erythema, no hernia, no seroma, no swelling, no dehiscence, incision well approximated       Pelvic: good support. Cuff support excellent    rectal : smooth ant support  Assessment:    Doing well postoperatively. Operative findings again reviewed. Pathology report discussed.    Plan:    1. Continue any current medications. Continue vaginal estrogen weekly  Additional medication: myrbetriq 25 mg daily 2. Wound care discussed. 3. Activity restrictions: no bending, stooping, or squatting 4. Anticipated return to work: not applicable. 5. Follow up: 1 yr for  Annual check. Released for normal activities. Patient is not sexually active.

## 2013-03-20 NOTE — Patient Instructions (Signed)
Continue estrace weekly

## 2013-03-20 NOTE — Assessment & Plan Note (Signed)
Excellent postop course

## 2013-03-22 ENCOUNTER — Ambulatory Visit (INDEPENDENT_AMBULATORY_CARE_PROVIDER_SITE_OTHER): Payer: Medicare Other | Admitting: *Deleted

## 2013-03-22 DIAGNOSIS — Z7901 Long term (current) use of anticoagulants: Secondary | ICD-10-CM | POA: Diagnosis not present

## 2013-03-22 DIAGNOSIS — I4891 Unspecified atrial fibrillation: Secondary | ICD-10-CM | POA: Diagnosis not present

## 2013-03-27 ENCOUNTER — Ambulatory Visit: Payer: Medicare Other | Admitting: Obstetrics & Gynecology

## 2013-04-07 DIAGNOSIS — I4891 Unspecified atrial fibrillation: Secondary | ICD-10-CM | POA: Diagnosis not present

## 2013-04-07 DIAGNOSIS — J449 Chronic obstructive pulmonary disease, unspecified: Secondary | ICD-10-CM | POA: Diagnosis not present

## 2013-04-07 DIAGNOSIS — E785 Hyperlipidemia, unspecified: Secondary | ICD-10-CM | POA: Diagnosis not present

## 2013-04-07 DIAGNOSIS — I10 Essential (primary) hypertension: Secondary | ICD-10-CM | POA: Diagnosis not present

## 2013-04-19 ENCOUNTER — Telehealth: Payer: Self-pay | Admitting: *Deleted

## 2013-04-19 ENCOUNTER — Ambulatory Visit (INDEPENDENT_AMBULATORY_CARE_PROVIDER_SITE_OTHER): Payer: Medicare Other | Admitting: *Deleted

## 2013-04-19 DIAGNOSIS — Z7901 Long term (current) use of anticoagulants: Secondary | ICD-10-CM

## 2013-04-19 DIAGNOSIS — I4891 Unspecified atrial fibrillation: Secondary | ICD-10-CM | POA: Diagnosis not present

## 2013-04-19 NOTE — Telephone Encounter (Signed)
LMOM with instructions below per pt request.

## 2013-04-19 NOTE — Telephone Encounter (Signed)
Message copied by Kyung Rudd on Wed Apr 19, 2013  3:05 PM ------      Message from: [place] C      Created: Wed Apr 19, 2013  1:39 PM       Okay to try. If she begins to have increased palpitations she can stop it. I hope you and your family are doing well.      ----- Message -----         From: Louanna Raw, RN         Sent: 04/19/2013  11:11 AM           To: Gaylord Shih, MD            Pt was started on Mirabegron 25mg  daily by Dr Emelda Fear for post-op urinary incontinence.  Insert states chance of increased palpitations.  She wants to know if it is OK with you to take this before she starts it.  Please advise.       ------

## 2013-04-27 DIAGNOSIS — T148 Other injury of unspecified body region: Secondary | ICD-10-CM | POA: Diagnosis not present

## 2013-04-27 DIAGNOSIS — A938 Other specified arthropod-borne viral fevers: Secondary | ICD-10-CM | POA: Diagnosis not present

## 2013-06-05 ENCOUNTER — Other Ambulatory Visit: Payer: Self-pay | Admitting: *Deleted

## 2013-06-05 ENCOUNTER — Ambulatory Visit (INDEPENDENT_AMBULATORY_CARE_PROVIDER_SITE_OTHER): Payer: Medicare Other | Admitting: *Deleted

## 2013-06-05 DIAGNOSIS — Z7901 Long term (current) use of anticoagulants: Secondary | ICD-10-CM

## 2013-06-05 DIAGNOSIS — I4891 Unspecified atrial fibrillation: Secondary | ICD-10-CM | POA: Diagnosis not present

## 2013-06-05 LAB — POCT INR: INR: 1.9

## 2013-06-06 MED ORDER — MIRABEGRON ER 25 MG PO TB24: 25.0000 mg | ORAL_TABLET | Freq: Every day | ORAL | Status: DC

## 2013-07-03 ENCOUNTER — Ambulatory Visit (INDEPENDENT_AMBULATORY_CARE_PROVIDER_SITE_OTHER): Payer: Medicare Other | Admitting: *Deleted

## 2013-07-03 DIAGNOSIS — I4891 Unspecified atrial fibrillation: Secondary | ICD-10-CM | POA: Diagnosis not present

## 2013-07-03 DIAGNOSIS — Z7901 Long term (current) use of anticoagulants: Secondary | ICD-10-CM | POA: Diagnosis not present

## 2013-07-07 DIAGNOSIS — Z23 Encounter for immunization: Secondary | ICD-10-CM | POA: Diagnosis not present

## 2013-07-13 ENCOUNTER — Other Ambulatory Visit: Payer: Self-pay | Admitting: Obstetrics and Gynecology

## 2013-07-13 DIAGNOSIS — Z139 Encounter for screening, unspecified: Secondary | ICD-10-CM

## 2013-08-02 ENCOUNTER — Other Ambulatory Visit: Payer: Self-pay | Admitting: Obstetrics and Gynecology

## 2013-08-02 ENCOUNTER — Ambulatory Visit (INDEPENDENT_AMBULATORY_CARE_PROVIDER_SITE_OTHER): Payer: Medicare Other | Admitting: *Deleted

## 2013-08-02 DIAGNOSIS — Z139 Encounter for screening, unspecified: Secondary | ICD-10-CM

## 2013-08-02 DIAGNOSIS — I4891 Unspecified atrial fibrillation: Secondary | ICD-10-CM

## 2013-08-02 DIAGNOSIS — Z7901 Long term (current) use of anticoagulants: Secondary | ICD-10-CM

## 2013-08-03 ENCOUNTER — Ambulatory Visit (HOSPITAL_COMMUNITY)
Admission: RE | Admit: 2013-08-03 | Discharge: 2013-08-03 | Disposition: A | Discharge status: Home / Self Care | Payer: Medicare Other | Source: Ambulatory Visit | Attending: Obstetrics and Gynecology | Admitting: Obstetrics and Gynecology

## 2013-08-03 DIAGNOSIS — Z1231 Encounter for screening mammogram for malignant neoplasm of breast: Secondary | ICD-10-CM | POA: Insufficient documentation

## 2013-08-03 DIAGNOSIS — Z139 Encounter for screening, unspecified: Secondary | ICD-10-CM

## 2013-08-30 ENCOUNTER — Encounter: Payer: Self-pay | Admitting: *Deleted

## 2013-08-30 ENCOUNTER — Ambulatory Visit (INDEPENDENT_AMBULATORY_CARE_PROVIDER_SITE_OTHER): Payer: Medicare Other | Admitting: *Deleted

## 2013-08-30 DIAGNOSIS — I4891 Unspecified atrial fibrillation: Secondary | ICD-10-CM

## 2013-08-30 DIAGNOSIS — Z7901 Long term (current) use of anticoagulants: Secondary | ICD-10-CM

## 2013-09-01 DIAGNOSIS — J45909 Unspecified asthma, uncomplicated: Secondary | ICD-10-CM | POA: Diagnosis not present

## 2013-09-01 DIAGNOSIS — J3089 Other allergic rhinitis: Secondary | ICD-10-CM | POA: Diagnosis not present

## 2013-09-01 DIAGNOSIS — J301 Allergic rhinitis due to pollen: Secondary | ICD-10-CM | POA: Diagnosis not present

## 2013-09-01 DIAGNOSIS — H1045 Other chronic allergic conjunctivitis: Secondary | ICD-10-CM | POA: Diagnosis not present

## 2013-09-15 DIAGNOSIS — J069 Acute upper respiratory infection, unspecified: Secondary | ICD-10-CM | POA: Diagnosis not present

## 2013-09-15 DIAGNOSIS — J45909 Unspecified asthma, uncomplicated: Secondary | ICD-10-CM | POA: Diagnosis not present

## 2013-09-20 DIAGNOSIS — J209 Acute bronchitis, unspecified: Secondary | ICD-10-CM | POA: Diagnosis not present

## 2013-09-20 DIAGNOSIS — J449 Chronic obstructive pulmonary disease, unspecified: Secondary | ICD-10-CM | POA: Diagnosis not present

## 2013-10-02 DIAGNOSIS — I10 Essential (primary) hypertension: Secondary | ICD-10-CM | POA: Diagnosis not present

## 2013-10-02 DIAGNOSIS — G250 Essential tremor: Secondary | ICD-10-CM | POA: Diagnosis not present

## 2013-10-02 DIAGNOSIS — I4891 Unspecified atrial fibrillation: Secondary | ICD-10-CM | POA: Diagnosis not present

## 2013-10-02 DIAGNOSIS — J449 Chronic obstructive pulmonary disease, unspecified: Secondary | ICD-10-CM | POA: Diagnosis not present

## 2013-10-16 ENCOUNTER — Ambulatory Visit (INDEPENDENT_AMBULATORY_CARE_PROVIDER_SITE_OTHER): Payer: Medicare Other | Admitting: *Deleted

## 2013-10-16 DIAGNOSIS — I4891 Unspecified atrial fibrillation: Secondary | ICD-10-CM | POA: Diagnosis not present

## 2013-10-16 DIAGNOSIS — Z7901 Long term (current) use of anticoagulants: Secondary | ICD-10-CM

## 2013-10-24 ENCOUNTER — Encounter: Payer: Self-pay | Admitting: Neurology

## 2013-10-25 ENCOUNTER — Encounter (INDEPENDENT_AMBULATORY_CARE_PROVIDER_SITE_OTHER): Payer: Self-pay

## 2013-10-25 ENCOUNTER — Encounter: Payer: Self-pay | Admitting: Neurology

## 2013-10-25 ENCOUNTER — Ambulatory Visit (INDEPENDENT_AMBULATORY_CARE_PROVIDER_SITE_OTHER): Payer: Medicare Other | Admitting: Neurology

## 2013-10-25 VITALS — BP 132/60 | HR 64 | Ht 64.0 in | Wt 140.5 lb

## 2013-10-25 DIAGNOSIS — G250 Essential tremor: Secondary | ICD-10-CM | POA: Diagnosis not present

## 2013-10-25 DIAGNOSIS — G252 Other specified forms of tremor: Principal | ICD-10-CM

## 2013-10-25 MED ORDER — PRIMIDONE 50 MG PO TABS: 50.0000 mg | ORAL_TABLET | Freq: Every day | ORAL | Status: DC

## 2013-10-25 NOTE — Progress Notes (Addendum)
Reason for visit: Tremor  [name] is a 80 y.o. female  History of present illness:  [name] is a 80 year old right-handed white female with a history of a tremor that began about 15 years ago, but the tremor has gradually worsened over time. The patient indicates a strong family history of similar tremors that were found in her father, one brother, and that brother's son. The patient indicates that she is having difficulty with handwriting and feeding herself. The patient denies any significant issues with balance or falls. The patient denies neck pain or back pain or problems controlling the bowels or the bladder. The patient has been on Topamax for the tremor without much benefit. The patient is sent to this office for further evaluation, as the tremor is worsening. The patient has difficulty typing on the computer.  Past Medical History  Diagnosis Date  . Hypertension   . SVT (supraventricular tachycardia)   . Atrial fibrillation   . Asthma   . Shortness of breath   . Tremors of nervous system     hands only  . Lung disease   . Essential and other specified forms of tremor 10/25/2013    Past Surgical History  Procedure Laterality Date  . Colonoscopy N/A 12/29/2012    Procedure: COLONOSCOPY;  Surgeon: Rogene Houston, MD;  Location: [facility];  Service: Endoscopy;  Laterality: N/A;  12:15  . Breast biopsy Left 10 yrs ago    Dr Marolyn Hammock  . Vaginal hysterectomy N/A 02/07/2013    Procedure: HYSTERECTOMY VAGINAL;  Surgeon: Jonnie Kind, MD;  Location: [facility];  Service: Gynecology;  Laterality: N/A;  . Salpingoophorectomy Bilateral 02/07/2013    Procedure: SALPINGO OOPHORECTOMY;  Surgeon: Jonnie Kind, MD;  Location: [facility];  Service: Gynecology;  Laterality: Bilateral;  . Anterior and posterior repair N/A 02/07/2013    Procedure: ANTERIOR (CYSTOCELE) AND POSTERIOR REPAIR (RECTOCELE);  Surgeon: Jonnie Kind, MD;  Location: [facility];  Service: Gynecology;   Laterality: N/A;  . Cystoscopy N/A 02/07/2013    Procedure: CYSTOSCOPY;  Surgeon: Jonnie Kind, MD;  Location: [facility];  Service: Gynecology;  Laterality: N/A;    Family History  Problem Relation Age of Onset  . Cancer Mother     breast  . Heart attack Father   . Heart disease Father   . Tremor Father   . Diabetes Son   . Cancer Brother   . Tremor Other   . Tremor Brother     Social history:  reports that she quit smoking about 22 years ago. Her smoking use included Cigarettes. She has a 20 pack-year smoking history. She has never used smokeless tobacco. She reports that she does not drink alcohol or use illicit drugs.  Medications:  Current Outpatient Prescriptions on File Prior to Visit  Medication Sig Dispense Refill  . albuterol (PROVENTIL HFA;VENTOLIN HFA) 108 (90 BASE) MCG/ACT inhaler Inhale 1 puff into the lungs every 6 (six) hours as needed for wheezing or shortness of breath.      . benazepril-hydrochlorthiazide (LOTENSIN HCT) 10-12.5 MG per tablet Take 0.5 tablets by mouth daily.       . budesonide-formoterol (SYMBICORT) 160-4.5 MCG/ACT inhaler Inhale 2 puffs into the lungs 2 (two) times daily.        Marland Kitchen diltiazem (CARDIZEM CD) 240 MG 24 hr capsule Take 240 mg by mouth daily.        Marland Kitchen diltiazem (CARDIZEM) 60 MG tablet Take 1 tablet (60 mg  total) by mouth as needed. For palpitations/pt. is also taking Diltiazem CD 240 mg daily  180 tablet  3  . fish oil-omega-3 fatty acids 1000 MG capsule Take 1 g by mouth daily.        . Fluticasone-Salmeterol (ADVAIR) 250-50 MCG/DOSE AEPB Inhale 1 puff into the lungs 2 (two) times daily.      Marland Kitchen levocetirizine (XYZAL) 5 MG tablet Take 5 mg by mouth daily.        . mirabegron ER (MYRBETRIQ) 25 MG TB24 tablet Take 1 tablet (25 mg total) by mouth daily.  90 tablet  1  . montelukast (SINGULAIR) 10 MG tablet Take 10 mg by mouth at bedtime.        . Multiple Vitamins-Minerals (MULTIVITAMIN WITH MINERALS) tablet Take 1 tablet by mouth daily.         . rosuvastatin (CRESTOR) 10 MG tablet Take 10 mg by mouth daily.        Marland Kitchen topiramate (TOPAMAX) 25 MG tablet Take 25 mg by mouth 2 (two) times daily.        Marland Kitchen warfarin (COUMADIN) 5 MG tablet Take 2.5-5 mg by mouth daily. 1/2 tab T-W-F-Sat-Sun, 5mg  on Monday and Thursday.      . zolpidem (AMBIEN) 5 MG tablet Take 2.5 mg by mouth at bedtime as needed. sleep       No current facility-administered medications on file prior to visit.      Allergies  Allergen Reactions  . Penicillins     "I don't remember the reaction", able to take Cephalosporins  . Codeine     Nausea    ROS:  Out of a complete 14 system review of symptoms, the patient complains only of the following symptoms, and all other reviewed systems are negative.  Tremor  Blood pressure 132/60, pulse 64, height 5\' 4"  (1.626 m), weight 140 lb 8 oz (63.73 kg).  Physical Exam  General: The patient is alert and cooperative at the time of the examination.  Eyes: Pupils are equal, round, and reactive to light. Discs are flat bilaterally.  Neck: The neck is supple, no carotid bruits are noted.  Respiratory: The respiratory examination is clear.  Cardiovascular: The cardiovascular examination reveals a regular rate and rhythm, no obvious murmurs or rubs are noted.  Skin: Extremities are without significant edema.  Neurologic Exam  Mental status: The patient is alert and oriented x 3 at the time of the examination. The patient has apparent normal recent and remote memory, with an apparently normal attention span and concentration ability.  Cranial nerves: Facial symmetry is present. There is good sensation of the face to pinprick and soft touch bilaterally. The strength of the facial muscles and the muscles to head turning and shoulder shrug are normal bilaterally. Speech is well enunciated, no aphasia or dysarthria is noted. Extraocular movements are full. Visual fields are full. The tongue is midline, and the patient has  symmetric elevation of the soft palate. No obvious hearing deficits are noted. A mild vocal tremor was noted, and the patient has intermittent side-to-side head tremor.  Motor: The motor testing reveals 5 over 5 strength of all 4 extremities. Good symmetric motor tone is noted throughout.  Sensory: Sensory testing is intact to pinprick, soft touch, vibration sensation, and position sense on all 4 extremities. No evidence of extinction is noted.  Coordination: Cerebellar testing reveals good finger-nose-finger and heel-to-shin bilaterally. The patient has an intention tremor bilaterally with the upper extremities with finger-nose-finger.  Gait and  station: Gait is normal. Tandem gait is slightly unsteady. Romberg is negative. No drift is seen.  Reflexes: Deep tendon reflexes are symmetric and normal bilaterally. Toes are downgoing bilaterally.   Assessment/Plan:  One. Benign essential tremor  The patient has tremor affecting both arms, head and neck, and a mild vocal tremor. The patient has a strong family history, consistent with the benign essential tremors. The patient will be placed on low-dose primidone, and the dose will gradually be increased over time. The patient will followup in 6 months. The patient will contact our office if she has significant side effects on the medication. The patient will taper off of the Topamax going to 25 mg daily for one week, and then stop the medication.  Jill Alexanders MD 10/25/2013 9:29 PM  [facility] [address] [place], [place] [address]  Phone [phone] Fax [phone]

## 2013-10-25 NOTE — Patient Instructions (Signed)
Tremor  Tremor is a rhythmic, involuntary muscular contraction characterized by oscillations (to-and-fro movements) of a part of the body. The most common of all involuntary movements, tremor can affect various body parts such as the hands, head, facial structures, vocal cords, trunk, and legs; most tremors, however, occur in the hands. Tremor often accompanies neurological disorders associated with aging. Although the disorder is not life-threatening, it can be responsible for functional disability and social embarrassment.  TREATMENT   There are many types of tremor and several ways in which tremor is classified. The most common classification is by behavioral context or position. There are five categories of tremor within this classification: resting, postural, kinetic, task-specific, and psychogenic. Resting or static tremor occurs when the muscle is at rest, for example when the hands are lying on the lap. This type of tremor is often seen in patients with Parkinson's disease. Postural tremor occurs when a patient attempts to maintain posture, such as holding the hands outstretched. Postural tremors include physiological tremor, essential tremor, tremor with basal ganglia disease (also seen in patients with Parkinson's disease), cerebellar postural tremor, tremor with peripheral neuropathy, post-traumatic tremor, and alcoholic tremor. Kinetic or intention (action) tremor occurs during purposeful movement, for example during finger-to-nose testing. Task-specific tremor appears when performing goal-oriented tasks such as handwriting, speaking, or standing. This group consists of primary writing tremor, vocal tremor, and orthostatic tremor. Psychogenic tremor occurs in both older and younger patients. The key feature of this tremor is that it dramatically lessens or disappears when the patient is distracted.  PROGNOSIS  There are some treatment options available for tremor; the appropriate treatment depends on  accurate diagnosis of the cause. Some tremors respond to treatment of the underlying condition, for example in some cases of psychogenic tremor treating the patient's underlying mental problem may cause the tremor to disappear. Also, patients with tremor due to Parkinson's disease may be treated with Levodopa drug therapy. Symptomatic drug therapy is available for several other tremors as well. For those cases of tremor in which there is no effective drug treatment, physical measures such as teaching the patient to brace the affected limb during the tremor are sometimes useful. Surgical intervention such as thalamotomy or deep brain stimulation may be useful in certain cases.  Document Released: 09/25/2002 Document Revised: 12/28/2011 Document Reviewed: 10/05/2005  [employer]® Patient Information ©2014 [employer].

## 2013-11-13 ENCOUNTER — Encounter (INDEPENDENT_AMBULATORY_CARE_PROVIDER_SITE_OTHER): Payer: Self-pay

## 2013-11-13 ENCOUNTER — Ambulatory Visit (INDEPENDENT_AMBULATORY_CARE_PROVIDER_SITE_OTHER): Payer: Medicare Other | Admitting: *Deleted

## 2013-11-13 DIAGNOSIS — I4891 Unspecified atrial fibrillation: Secondary | ICD-10-CM

## 2013-11-13 DIAGNOSIS — Z7901 Long term (current) use of anticoagulants: Secondary | ICD-10-CM

## 2013-11-13 DIAGNOSIS — Z5181 Encounter for therapeutic drug level monitoring: Secondary | ICD-10-CM

## 2013-11-13 LAB — POCT INR: INR: 3.5

## 2013-11-22 ENCOUNTER — Ambulatory Visit (INDEPENDENT_AMBULATORY_CARE_PROVIDER_SITE_OTHER): Payer: Medicare Other | Admitting: Cardiology

## 2013-11-22 ENCOUNTER — Encounter: Payer: Self-pay | Admitting: Cardiology

## 2013-11-22 VITALS — BP 144/55 | HR 77 | Ht 62.0 in | Wt 139.0 lb

## 2013-11-22 DIAGNOSIS — I10 Essential (primary) hypertension: Secondary | ICD-10-CM

## 2013-11-22 DIAGNOSIS — I480 Paroxysmal atrial fibrillation: Secondary | ICD-10-CM

## 2013-11-22 DIAGNOSIS — I4891 Unspecified atrial fibrillation: Secondary | ICD-10-CM | POA: Diagnosis not present

## 2013-11-22 NOTE — Patient Instructions (Signed)
Your physician wants you to follow-up in: ONE YEAR You will receive a reminder letter in the mail two months in advance. If you don't receive a letter, please call our office to schedule the follow-up appointment.  

## 2013-11-22 NOTE — Assessment & Plan Note (Signed)
Blood pressure mildly elevated today. Keep follow with Dr. Luan Pulling.

## 2013-11-22 NOTE — Progress Notes (Signed)
Clinical Summary Ms. Colton is a 80 y.o.female presenting for an office visit. She is a former patient of Dr. Verl Blalock last seen in February 2014. Her husband is my patient. She states that she has been having to provide more and more care for him due to progressive chronic illnesses, recent pneumonia. As far as her own health, she reports no major problems, only occasional palpitations. She rarely has to use additional as needed Cardizem. She reports no problems with her Coumadin in terms of bleeding. ECG today shows normal sinus rhythm.   Allergies  Allergen Reactions  . Penicillins     "I don't remember the reaction", able to take Cephalosporins  . Codeine     Nausea    Current Outpatient Prescriptions  Medication Sig Dispense Refill  . albuterol (PROVENTIL HFA;VENTOLIN HFA) 108 (90 BASE) MCG/ACT inhaler Inhale 1 puff into the lungs every 6 (six) hours as needed for wheezing or shortness of breath.      . benazepril-hydrochlorthiazide (LOTENSIN HCT) 10-12.5 MG per tablet Take 0.5 tablets by mouth daily.       . budesonide-formoterol (SYMBICORT) 160-4.5 MCG/ACT inhaler Inhale 2 puffs into the lungs 2 (two) times daily.        Marland Kitchen diltiazem (CARDIZEM CD) 240 MG 24 hr capsule Take 240 mg by mouth daily.        Marland Kitchen diltiazem (CARDIZEM) 60 MG tablet Take 1 tablet (60 mg total) by mouth as needed. For palpitations/pt. is also taking Diltiazem CD 240 mg daily  180 tablet  3  . fish oil-omega-3 fatty acids 1000 MG capsule Take 1 g by mouth daily.        . Fluticasone-Salmeterol (ADVAIR) 250-50 MCG/DOSE AEPB Inhale 1 puff into the lungs 2 (two) times daily.      Marland Kitchen levocetirizine (XYZAL) 5 MG tablet Take 5 mg by mouth daily.        . mirabegron ER (MYRBETRIQ) 25 MG TB24 tablet Take 1 tablet (25 mg total) by mouth daily.  90 tablet  1  . montelukast (SINGULAIR) 10 MG tablet Take 10 mg by mouth at bedtime.        . Multiple Vitamins-Minerals (MULTIVITAMIN WITH MINERALS) tablet Take 1 tablet by mouth  daily.        . rosuvastatin (CRESTOR) 10 MG tablet Take 10 mg by mouth daily.        Marland Kitchen topiramate (TOPAMAX) 25 MG tablet Take 25 mg by mouth daily.       Marland Kitchen warfarin (COUMADIN) 5 MG tablet Take 2.5-5 mg by mouth daily. 1/2 tab T-W-F-Sat-Sun, 5mg  on Monday and Thursday.      . zolpidem (AMBIEN) 5 MG tablet Take 2.5 mg by mouth at bedtime as needed. sleep       No current facility-administered medications for this visit.    Past Medical History  Diagnosis Date  . Essential hypertension, benign   . Paroxysmal atrial fibrillation   . Asthma   . Benign essential tremor     Social History Ms. Welke reports that she quit smoking about 22 years ago. Her smoking use included Cigarettes. She has a 20 pack-year smoking history. She has never used smokeless tobacco. Ms. Steinbach reports that she does not drink alcohol.  Review of Systems No chest pain, cough, breathlessness, orthopnea or PND. Otherwise as outlined.  Physical Examination Filed Vitals:   11/22/13 0949  BP: 144/55  Pulse: 77   Filed Weights   11/22/13 0949  Weight: 139 lb (63.05  kg)   Appears comfortable at rest. HEENT: Conjunctiva and lids normal, oropharynx clear. Neck: Supple, no elevated JVP or carotid bruits, no thyromegaly. Lungs: Clear to auscultation, nonlabored breathing at rest. Cardiac: Regular rate and rhythm, no S3 or significant systolic murmur, no pericardial rub. Extremities: No pitting edema, distal pulses 2+. Neuropsychiatric: Alert and oriented x3, affect grossly appropriate.   Problem List and Plan   Paroxysmal atrial fibrillation Symptomatically well controlled on current regimen. No changes made. Keep follow up in Coumadin clinic.  HYPERTENSION Blood pressure mildly elevated today. Keep follow with Dr. Luan Pulling.    Satira Sark, M.D., F.A.C.C.

## 2013-11-22 NOTE — Assessment & Plan Note (Signed)
Symptomatically well controlled on current regimen. No changes made. Keep follow up in Coumadin clinic.

## 2013-12-04 ENCOUNTER — Ambulatory Visit (INDEPENDENT_AMBULATORY_CARE_PROVIDER_SITE_OTHER): Payer: Medicare Other | Admitting: *Deleted

## 2013-12-04 DIAGNOSIS — Z5181 Encounter for therapeutic drug level monitoring: Secondary | ICD-10-CM

## 2013-12-04 DIAGNOSIS — I4891 Unspecified atrial fibrillation: Secondary | ICD-10-CM

## 2013-12-04 DIAGNOSIS — Z7901 Long term (current) use of anticoagulants: Secondary | ICD-10-CM

## 2013-12-04 LAB — POCT INR: INR: 3

## 2014-01-01 ENCOUNTER — Ambulatory Visit (INDEPENDENT_AMBULATORY_CARE_PROVIDER_SITE_OTHER): Payer: Medicare Other | Admitting: *Deleted

## 2014-01-01 DIAGNOSIS — Z5181 Encounter for therapeutic drug level monitoring: Secondary | ICD-10-CM | POA: Diagnosis not present

## 2014-01-01 DIAGNOSIS — Z7901 Long term (current) use of anticoagulants: Secondary | ICD-10-CM | POA: Diagnosis not present

## 2014-01-01 DIAGNOSIS — I4891 Unspecified atrial fibrillation: Secondary | ICD-10-CM | POA: Diagnosis not present

## 2014-01-01 LAB — POCT INR: INR: 2.6

## 2014-01-29 ENCOUNTER — Ambulatory Visit (INDEPENDENT_AMBULATORY_CARE_PROVIDER_SITE_OTHER): Payer: Medicare Other | Admitting: *Deleted

## 2014-01-29 DIAGNOSIS — I4891 Unspecified atrial fibrillation: Secondary | ICD-10-CM

## 2014-01-29 DIAGNOSIS — Z7901 Long term (current) use of anticoagulants: Secondary | ICD-10-CM

## 2014-01-29 DIAGNOSIS — Z5181 Encounter for therapeutic drug level monitoring: Secondary | ICD-10-CM | POA: Diagnosis not present

## 2014-01-29 LAB — POCT INR: INR: 2.8

## 2014-03-14 ENCOUNTER — Ambulatory Visit (INDEPENDENT_AMBULATORY_CARE_PROVIDER_SITE_OTHER): Payer: Medicare Other | Admitting: *Deleted

## 2014-03-14 DIAGNOSIS — I4891 Unspecified atrial fibrillation: Secondary | ICD-10-CM

## 2014-03-14 DIAGNOSIS — Z5181 Encounter for therapeutic drug level monitoring: Secondary | ICD-10-CM | POA: Diagnosis not present

## 2014-03-14 DIAGNOSIS — Z7901 Long term (current) use of anticoagulants: Secondary | ICD-10-CM

## 2014-03-14 LAB — POCT INR: INR: 2.3

## 2014-03-19 DIAGNOSIS — I4891 Unspecified atrial fibrillation: Secondary | ICD-10-CM | POA: Diagnosis not present

## 2014-03-19 DIAGNOSIS — J449 Chronic obstructive pulmonary disease, unspecified: Secondary | ICD-10-CM | POA: Diagnosis not present

## 2014-03-19 DIAGNOSIS — E785 Hyperlipidemia, unspecified: Secondary | ICD-10-CM | POA: Diagnosis not present

## 2014-03-19 DIAGNOSIS — I10 Essential (primary) hypertension: Secondary | ICD-10-CM | POA: Diagnosis not present

## 2014-03-26 DIAGNOSIS — J449 Chronic obstructive pulmonary disease, unspecified: Secondary | ICD-10-CM | POA: Diagnosis not present

## 2014-03-26 DIAGNOSIS — I10 Essential (primary) hypertension: Secondary | ICD-10-CM | POA: Diagnosis not present

## 2014-03-26 DIAGNOSIS — J209 Acute bronchitis, unspecified: Secondary | ICD-10-CM | POA: Diagnosis not present

## 2014-04-23 ENCOUNTER — Ambulatory Visit (INDEPENDENT_AMBULATORY_CARE_PROVIDER_SITE_OTHER): Payer: Medicare Other | Admitting: *Deleted

## 2014-04-23 DIAGNOSIS — I4891 Unspecified atrial fibrillation: Secondary | ICD-10-CM | POA: Diagnosis not present

## 2014-04-23 DIAGNOSIS — Z7901 Long term (current) use of anticoagulants: Secondary | ICD-10-CM

## 2014-04-23 DIAGNOSIS — Z5181 Encounter for therapeutic drug level monitoring: Secondary | ICD-10-CM

## 2014-04-23 LAB — POCT INR: INR: 3.4

## 2014-04-24 ENCOUNTER — Ambulatory Visit: Payer: Medicare Other | Admitting: Nurse Practitioner

## 2014-04-24 ENCOUNTER — Telehealth: Payer: Self-pay | Admitting: Nurse Practitioner

## 2014-04-24 NOTE — Telephone Encounter (Signed)
Less than 24 hour notice cancellation.

## 2014-05-21 ENCOUNTER — Ambulatory Visit (INDEPENDENT_AMBULATORY_CARE_PROVIDER_SITE_OTHER): Payer: Medicare Other | Admitting: *Deleted

## 2014-05-21 DIAGNOSIS — Z5181 Encounter for therapeutic drug level monitoring: Secondary | ICD-10-CM

## 2014-05-21 DIAGNOSIS — I4891 Unspecified atrial fibrillation: Secondary | ICD-10-CM | POA: Diagnosis not present

## 2014-05-21 DIAGNOSIS — Z7901 Long term (current) use of anticoagulants: Secondary | ICD-10-CM

## 2014-05-21 LAB — POCT INR: INR: 2.6

## 2014-06-18 ENCOUNTER — Ambulatory Visit (INDEPENDENT_AMBULATORY_CARE_PROVIDER_SITE_OTHER): Payer: Medicare Other | Admitting: *Deleted

## 2014-06-18 DIAGNOSIS — Z7901 Long term (current) use of anticoagulants: Secondary | ICD-10-CM

## 2014-06-18 DIAGNOSIS — I4891 Unspecified atrial fibrillation: Secondary | ICD-10-CM | POA: Diagnosis not present

## 2014-06-18 DIAGNOSIS — Z5181 Encounter for therapeutic drug level monitoring: Secondary | ICD-10-CM | POA: Diagnosis not present

## 2014-06-18 LAB — POCT INR: INR: 2.9

## 2014-07-16 ENCOUNTER — Ambulatory Visit (INDEPENDENT_AMBULATORY_CARE_PROVIDER_SITE_OTHER): Payer: Medicare Other | Admitting: *Deleted

## 2014-07-16 DIAGNOSIS — Z5181 Encounter for therapeutic drug level monitoring: Secondary | ICD-10-CM

## 2014-07-16 DIAGNOSIS — Z7901 Long term (current) use of anticoagulants: Secondary | ICD-10-CM | POA: Diagnosis not present

## 2014-07-16 DIAGNOSIS — I4891 Unspecified atrial fibrillation: Secondary | ICD-10-CM

## 2014-07-16 LAB — POCT INR: INR: 2.7

## 2014-07-23 ENCOUNTER — Other Ambulatory Visit (HOSPITAL_COMMUNITY): Payer: Self-pay | Admitting: Pulmonary Disease

## 2014-07-23 ENCOUNTER — Other Ambulatory Visit: Payer: Self-pay | Admitting: Obstetrics and Gynecology

## 2014-07-23 DIAGNOSIS — Z1231 Encounter for screening mammogram for malignant neoplasm of breast: Secondary | ICD-10-CM

## 2014-07-30 DIAGNOSIS — Z23 Encounter for immunization: Secondary | ICD-10-CM | POA: Diagnosis not present

## 2014-07-30 DIAGNOSIS — L821 Other seborrheic keratosis: Secondary | ICD-10-CM | POA: Diagnosis not present

## 2014-07-30 DIAGNOSIS — D1801 Hemangioma of skin and subcutaneous tissue: Secondary | ICD-10-CM | POA: Diagnosis not present

## 2014-08-06 ENCOUNTER — Ambulatory Visit (HOSPITAL_COMMUNITY): Payer: Medicare Other

## 2014-08-08 ENCOUNTER — Ambulatory Visit (HOSPITAL_COMMUNITY)
Admission: RE | Admit: 2014-08-08 | Discharge: 2014-08-08 | Disposition: A | Discharge status: Home / Self Care | Payer: Medicare Other | Source: Ambulatory Visit | Attending: Pulmonary Disease | Admitting: Pulmonary Disease

## 2014-08-08 DIAGNOSIS — Z1231 Encounter for screening mammogram for malignant neoplasm of breast: Secondary | ICD-10-CM | POA: Insufficient documentation

## 2014-08-20 ENCOUNTER — Encounter: Payer: Self-pay | Admitting: Cardiology

## 2014-08-21 DIAGNOSIS — H2513 Age-related nuclear cataract, bilateral: Secondary | ICD-10-CM | POA: Diagnosis not present

## 2014-09-03 ENCOUNTER — Ambulatory Visit (INDEPENDENT_AMBULATORY_CARE_PROVIDER_SITE_OTHER): Payer: Medicare Other | Admitting: *Deleted

## 2014-09-03 DIAGNOSIS — Z5181 Encounter for therapeutic drug level monitoring: Secondary | ICD-10-CM | POA: Diagnosis not present

## 2014-09-03 DIAGNOSIS — Z7901 Long term (current) use of anticoagulants: Secondary | ICD-10-CM | POA: Diagnosis not present

## 2014-09-03 DIAGNOSIS — I4891 Unspecified atrial fibrillation: Secondary | ICD-10-CM

## 2014-09-03 LAB — POCT INR: INR: 2.1

## 2014-09-05 ENCOUNTER — Encounter: Payer: Self-pay | Admitting: Neurology

## 2014-09-11 ENCOUNTER — Encounter: Payer: Self-pay | Admitting: Neurology

## 2014-09-26 DIAGNOSIS — I10 Essential (primary) hypertension: Secondary | ICD-10-CM | POA: Diagnosis not present

## 2014-09-26 DIAGNOSIS — G4700 Insomnia, unspecified: Secondary | ICD-10-CM | POA: Diagnosis not present

## 2014-09-26 DIAGNOSIS — J449 Chronic obstructive pulmonary disease, unspecified: Secondary | ICD-10-CM | POA: Diagnosis not present

## 2014-09-26 DIAGNOSIS — M542 Cervicalgia: Secondary | ICD-10-CM | POA: Diagnosis not present

## 2014-10-15 ENCOUNTER — Ambulatory Visit (INDEPENDENT_AMBULATORY_CARE_PROVIDER_SITE_OTHER): Payer: Medicare Other | Admitting: *Deleted

## 2014-10-15 DIAGNOSIS — I4891 Unspecified atrial fibrillation: Secondary | ICD-10-CM

## 2014-10-15 DIAGNOSIS — Z5181 Encounter for therapeutic drug level monitoring: Secondary | ICD-10-CM

## 2014-10-15 DIAGNOSIS — Z7901 Long term (current) use of anticoagulants: Secondary | ICD-10-CM | POA: Diagnosis not present

## 2014-10-15 LAB — POCT INR: INR: 4.5

## 2014-10-24 ENCOUNTER — Ambulatory Visit (INDEPENDENT_AMBULATORY_CARE_PROVIDER_SITE_OTHER): Payer: Medicare Other | Admitting: *Deleted

## 2014-10-24 DIAGNOSIS — I4891 Unspecified atrial fibrillation: Secondary | ICD-10-CM

## 2014-10-24 DIAGNOSIS — Z5181 Encounter for therapeutic drug level monitoring: Secondary | ICD-10-CM | POA: Diagnosis not present

## 2014-10-24 DIAGNOSIS — Z7901 Long term (current) use of anticoagulants: Secondary | ICD-10-CM | POA: Diagnosis not present

## 2014-10-24 LAB — POCT INR: INR: 2.5

## 2014-11-14 ENCOUNTER — Ambulatory Visit (INDEPENDENT_AMBULATORY_CARE_PROVIDER_SITE_OTHER): Payer: Medicare Other | Admitting: *Deleted

## 2014-11-14 DIAGNOSIS — Z5181 Encounter for therapeutic drug level monitoring: Secondary | ICD-10-CM

## 2014-11-14 DIAGNOSIS — I4891 Unspecified atrial fibrillation: Secondary | ICD-10-CM | POA: Diagnosis not present

## 2014-11-14 DIAGNOSIS — Z7901 Long term (current) use of anticoagulants: Secondary | ICD-10-CM

## 2014-11-14 LAB — POCT INR: INR: 2.6

## 2014-12-12 ENCOUNTER — Ambulatory Visit (INDEPENDENT_AMBULATORY_CARE_PROVIDER_SITE_OTHER): Payer: Medicare Other | Admitting: *Deleted

## 2014-12-12 DIAGNOSIS — Z7901 Long term (current) use of anticoagulants: Secondary | ICD-10-CM | POA: Diagnosis not present

## 2014-12-12 DIAGNOSIS — I4891 Unspecified atrial fibrillation: Secondary | ICD-10-CM | POA: Diagnosis not present

## 2014-12-12 DIAGNOSIS — Z5181 Encounter for therapeutic drug level monitoring: Secondary | ICD-10-CM

## 2014-12-12 LAB — POCT INR: INR: 3.3

## 2014-12-14 ENCOUNTER — Encounter: Payer: Self-pay | Admitting: Cardiology

## 2014-12-14 ENCOUNTER — Ambulatory Visit (INDEPENDENT_AMBULATORY_CARE_PROVIDER_SITE_OTHER): Payer: Medicare Other | Admitting: Cardiology

## 2014-12-14 VITALS — BP 122/70 | HR 89 | Ht 62.0 in | Wt 145.0 lb

## 2014-12-14 DIAGNOSIS — I480 Paroxysmal atrial fibrillation: Secondary | ICD-10-CM | POA: Diagnosis not present

## 2014-12-14 DIAGNOSIS — I10 Essential (primary) hypertension: Secondary | ICD-10-CM

## 2014-12-14 MED ORDER — DILTIAZEM HCL 60 MG PO TABS: ORAL_TABLET | ORAL | Status: DC

## 2014-12-14 NOTE — Progress Notes (Signed)
Cardiology Office Note  Date: 12/14/2014   ID: [name], DOB 1933/12/25, MRN [id number]  PCP: Alonza Bogus, MD  Primary Cardiologist: Rozann Lesches, MD   Chief Complaint  Patient presents with  . PAF  . Hypertension    History of Present Illness: [name] is a 81 y.o. female last seen in February 2015. She presents with her husband today for a routine visit. She continues to do very well, has had no significant palpitations to require any additional short acting Cardizem.  We reviewed her medications, she reports no spontaneous bleeding problems on Coumadin. She also continues to follow with Dr. Luan Pulling for primary care.  Follow-up ECG today is noted below.  Blood pressure control has been good.   Past Medical History  Diagnosis Date  . Essential hypertension, benign   . Paroxysmal atrial fibrillation   . Asthma   . Benign essential tremor     Current Outpatient Prescriptions  Medication Sig Dispense Refill  . albuterol (PROVENTIL HFA;VENTOLIN HFA) 108 (90 BASE) MCG/ACT inhaler Inhale 1 puff into the lungs every 6 (six) hours as needed for wheezing or shortness of breath.    . benazepril-hydrochlorthiazide (LOTENSIN HCT) 10-12.5 MG per tablet Take 0.5 tablets by mouth daily.     . budesonide-formoterol (SYMBICORT) 160-4.5 MCG/ACT inhaler Inhale 2 puffs into the lungs 2 (two) times daily.      Marland Kitchen diltiazem (CARDIZEM CD) 240 MG 24 hr capsule Take 240 mg by mouth daily.      . fish oil-omega-3 fatty acids 1000 MG capsule Take 1 g by mouth daily.      . Fluticasone-Salmeterol (ADVAIR) 250-50 MCG/DOSE AEPB Inhale 1 puff into the lungs 2 (two) times daily.    Marland Kitchen levocetirizine (XYZAL) 5 MG tablet Take 5 mg by mouth daily.      . montelukast (SINGULAIR) 10 MG tablet Take 10 mg by mouth at bedtime.      . Multiple Vitamins-Minerals (MULTIVITAMIN WITH MINERALS) tablet Take 1 tablet by mouth daily.      . rosuvastatin (CRESTOR) 10 MG tablet Take 10 mg by mouth  daily.      Marland Kitchen topiramate (TOPAMAX) 25 MG tablet Take 25 mg by mouth daily.     Marland Kitchen warfarin (COUMADIN) 5 MG tablet Take 2.5-5 mg by mouth daily. 1/2 tab T-W-F-Sat-Sun, 5mg  on Monday and Thursday.    . diltiazem (CARDIZEM) 60 MG tablet Please only fill 30 day vs 90 day supply gets wasted as pt rarely takes this prn med For palpitations/pt. is also taking Diltiazem CD 240 mg daily 30 tablet 11   No current facility-administered medications for this visit.    Allergies:  Penicillins and Codeine   Social History: The patient  reports that she quit smoking about 23 years ago. Her smoking use included Cigarettes. She started smoking about 62 years ago. She has a 20 pack-year smoking history. She has never used smokeless tobacco. She reports that she does not drink alcohol or use illicit drugs.   ROS:  Please see the history of present illness. Otherwise, complete review of systems is positive for none.  All other systems are reviewed and negative.    Physical Exam: VS:  BP 122/70 mmHg  Pulse 89  Ht 5\' 2"  (1.575 m)  Wt 145 lb (65.772 kg)  BMI 26.51 kg/m2  SpO2 96%, BMI Body mass index is 26.51 kg/(m^2).  Wt Readings from Last 3 Encounters:  12/14/14 145 lb (65.772 kg)  11/22/13 139 lb (  63.05 kg)  10/25/13 140 lb 8 oz (63.73 kg)     Appears comfortable at rest. HEENT: Conjunctiva and lids normal, oropharynx clear. Neck: Supple, no elevated JVP or carotid bruits, no thyromegaly. Lungs: Clear to auscultation, nonlabored breathing at rest. Cardiac: Regular rate and rhythm, no S3 or significant systolic murmur, no pericardial rub. Extremities: No pitting edema, distal pulses 2+. Neuropsychiatric: Alert and oriented x3, affect grossly appropriate.   ECG: ECG is ordered today and reviewed finding normal sinus rhythm.   ASSESSMENT AND PLAN:  1. Paroxysmal atrial fibrillation, very well controlled on current regimen. No changes were made.  2. Essential hypertension, blood pressure is  normal today.  Current medicines are reviewed at length with the patient today.  The patient does not have concerns regarding medicines.   Orders Placed This Encounter  Procedures  . EKG 12-Lead    Disposition: FU with me in 1 year.   Signed, Satira Sark, MD, [facility] 12/14/2014 11:16 AM    [facility] [place]. [address], [place], [place] [address] Phone: [phone]; Fax: [phone]

## 2014-12-14 NOTE — Patient Instructions (Signed)

## 2014-12-21 DIAGNOSIS — J449 Chronic obstructive pulmonary disease, unspecified: Secondary | ICD-10-CM | POA: Diagnosis not present

## 2014-12-21 DIAGNOSIS — J209 Acute bronchitis, unspecified: Secondary | ICD-10-CM | POA: Diagnosis not present

## 2014-12-24 ENCOUNTER — Telehealth: Payer: Self-pay | Admitting: *Deleted

## 2014-12-24 NOTE — Telephone Encounter (Signed)
Dx with bronchitis last Friday.  Was started on Z pak.  Will finish tomorrow.  Also on prednisone 10mg  tablets. (4 tabs x3, 3 tabs x 3, 2 tabs x 3, 1 tab x 3)   Will finish 3/15.  Told pt to hold coumadin tonight then decrease dose to 1/2 tablet daily until INR check on 3/14.  She verbalized understanding.

## 2014-12-24 NOTE — Telephone Encounter (Signed)
Patient would like return phone call / tgs

## 2014-12-31 ENCOUNTER — Ambulatory Visit (INDEPENDENT_AMBULATORY_CARE_PROVIDER_SITE_OTHER): Payer: Medicare Other | Admitting: *Deleted

## 2014-12-31 DIAGNOSIS — Z5181 Encounter for therapeutic drug level monitoring: Secondary | ICD-10-CM

## 2014-12-31 DIAGNOSIS — Z7901 Long term (current) use of anticoagulants: Secondary | ICD-10-CM

## 2014-12-31 DIAGNOSIS — I4891 Unspecified atrial fibrillation: Secondary | ICD-10-CM | POA: Diagnosis not present

## 2014-12-31 LAB — POCT INR: INR: 2.9

## 2015-01-04 DIAGNOSIS — J301 Allergic rhinitis due to pollen: Secondary | ICD-10-CM | POA: Diagnosis not present

## 2015-01-04 DIAGNOSIS — H1045 Other chronic allergic conjunctivitis: Secondary | ICD-10-CM | POA: Diagnosis not present

## 2015-01-04 DIAGNOSIS — J3089 Other allergic rhinitis: Secondary | ICD-10-CM | POA: Diagnosis not present

## 2015-01-04 DIAGNOSIS — J4530 Mild persistent asthma, uncomplicated: Secondary | ICD-10-CM | POA: Diagnosis not present

## 2015-01-15 DIAGNOSIS — J449 Chronic obstructive pulmonary disease, unspecified: Secondary | ICD-10-CM | POA: Diagnosis not present

## 2015-01-15 DIAGNOSIS — J209 Acute bronchitis, unspecified: Secondary | ICD-10-CM | POA: Diagnosis not present

## 2015-01-16 ENCOUNTER — Ambulatory Visit (INDEPENDENT_AMBULATORY_CARE_PROVIDER_SITE_OTHER): Payer: Medicare Other | Admitting: *Deleted

## 2015-01-16 DIAGNOSIS — I4891 Unspecified atrial fibrillation: Secondary | ICD-10-CM

## 2015-01-16 DIAGNOSIS — Z5181 Encounter for therapeutic drug level monitoring: Secondary | ICD-10-CM

## 2015-01-16 DIAGNOSIS — Z7901 Long term (current) use of anticoagulants: Secondary | ICD-10-CM

## 2015-01-16 LAB — POCT INR: INR: 5.2

## 2015-01-28 ENCOUNTER — Ambulatory Visit (INDEPENDENT_AMBULATORY_CARE_PROVIDER_SITE_OTHER): Payer: Medicare Other | Admitting: *Deleted

## 2015-01-28 DIAGNOSIS — Z5181 Encounter for therapeutic drug level monitoring: Secondary | ICD-10-CM

## 2015-01-28 DIAGNOSIS — Z7901 Long term (current) use of anticoagulants: Secondary | ICD-10-CM | POA: Diagnosis not present

## 2015-01-28 DIAGNOSIS — I4891 Unspecified atrial fibrillation: Secondary | ICD-10-CM

## 2015-01-28 LAB — POCT INR: INR: 2.6

## 2015-02-18 ENCOUNTER — Ambulatory Visit (INDEPENDENT_AMBULATORY_CARE_PROVIDER_SITE_OTHER): Payer: Medicare Other | Admitting: *Deleted

## 2015-02-18 DIAGNOSIS — I4891 Unspecified atrial fibrillation: Secondary | ICD-10-CM | POA: Diagnosis not present

## 2015-02-18 DIAGNOSIS — Z7901 Long term (current) use of anticoagulants: Secondary | ICD-10-CM | POA: Diagnosis not present

## 2015-02-18 DIAGNOSIS — Z5181 Encounter for therapeutic drug level monitoring: Secondary | ICD-10-CM

## 2015-02-18 LAB — POCT INR: INR: 2.3

## 2015-03-25 ENCOUNTER — Ambulatory Visit (INDEPENDENT_AMBULATORY_CARE_PROVIDER_SITE_OTHER): Payer: Medicare Other | Admitting: *Deleted

## 2015-03-25 DIAGNOSIS — I4891 Unspecified atrial fibrillation: Secondary | ICD-10-CM | POA: Diagnosis not present

## 2015-03-25 DIAGNOSIS — Z5181 Encounter for therapeutic drug level monitoring: Secondary | ICD-10-CM

## 2015-03-25 DIAGNOSIS — Z7901 Long term (current) use of anticoagulants: Secondary | ICD-10-CM | POA: Diagnosis not present

## 2015-03-25 LAB — POCT INR: INR: 2.5

## 2015-03-28 DIAGNOSIS — J449 Chronic obstructive pulmonary disease, unspecified: Secondary | ICD-10-CM | POA: Diagnosis not present

## 2015-03-28 DIAGNOSIS — R251 Tremor, unspecified: Secondary | ICD-10-CM | POA: Diagnosis not present

## 2015-03-28 DIAGNOSIS — I482 Chronic atrial fibrillation: Secondary | ICD-10-CM | POA: Diagnosis not present

## 2015-03-28 DIAGNOSIS — I10 Essential (primary) hypertension: Secondary | ICD-10-CM | POA: Diagnosis not present

## 2015-04-21 ENCOUNTER — Emergency Department (HOSPITAL_COMMUNITY)
Admission: EM | Admit: 2015-04-21 | Discharge: 2015-04-21 | Disposition: A | Discharge status: Home / Self Care | Payer: Medicare Other | Attending: Emergency Medicine | Admitting: Emergency Medicine

## 2015-04-21 ENCOUNTER — Emergency Department (HOSPITAL_COMMUNITY): Payer: Medicare Other

## 2015-04-21 ENCOUNTER — Encounter (HOSPITAL_COMMUNITY): Payer: Self-pay | Admitting: Emergency Medicine

## 2015-04-21 DIAGNOSIS — R0602 Shortness of breath: Secondary | ICD-10-CM | POA: Diagnosis present

## 2015-04-21 DIAGNOSIS — Z7951 Long term (current) use of inhaled steroids: Secondary | ICD-10-CM | POA: Diagnosis not present

## 2015-04-21 DIAGNOSIS — Z87891 Personal history of nicotine dependence: Secondary | ICD-10-CM | POA: Diagnosis not present

## 2015-04-21 DIAGNOSIS — Z8669 Personal history of other diseases of the nervous system and sense organs: Secondary | ICD-10-CM | POA: Diagnosis not present

## 2015-04-21 DIAGNOSIS — Z7901 Long term (current) use of anticoagulants: Secondary | ICD-10-CM | POA: Insufficient documentation

## 2015-04-21 DIAGNOSIS — I10 Essential (primary) hypertension: Secondary | ICD-10-CM | POA: Diagnosis not present

## 2015-04-21 DIAGNOSIS — Z880 Allergy status to penicillin: Secondary | ICD-10-CM | POA: Diagnosis not present

## 2015-04-21 DIAGNOSIS — Z79899 Other long term (current) drug therapy: Secondary | ICD-10-CM | POA: Diagnosis not present

## 2015-04-21 DIAGNOSIS — J441 Chronic obstructive pulmonary disease with (acute) exacerbation: Secondary | ICD-10-CM | POA: Diagnosis not present

## 2015-04-21 HISTORY — DX: Chronic obstructive pulmonary disease, unspecified: J44.9

## 2015-04-21 LAB — CBC
HEMATOCRIT: 39.6 % (ref 36.0–46.0)
HEMOGLOBIN: 12.9 g/dL (ref 12.0–15.0)
MCH: 30.6 pg (ref 26.0–34.0)
MCHC: 32.6 g/dL (ref 30.0–36.0)
MCV: 93.8 fL (ref 78.0–100.0)
Platelets: 220 10*3/uL (ref 150–400)
RBC: 4.22 MIL/uL (ref 3.87–5.11)
RDW: 13 % (ref 11.5–15.5)
WBC: 9.3 10*3/uL (ref 4.0–10.5)

## 2015-04-21 LAB — BASIC METABOLIC PANEL
Anion gap: 8 (ref 5–15)
BUN: 12 mg/dL (ref 6–20)
CALCIUM: 8.3 mg/dL — AB (ref 8.9–10.3)
CHLORIDE: 103 mmol/L (ref 101–111)
CO2: 24 mmol/L (ref 22–32)
Creatinine, Ser: 0.85 mg/dL (ref 0.44–1.00)
Glucose, Bld: 116 mg/dL — ABNORMAL HIGH (ref 65–99)
Potassium: 3.8 mmol/L (ref 3.5–5.1)
Sodium: 135 mmol/L (ref 135–145)

## 2015-04-21 LAB — BRAIN NATRIURETIC PEPTIDE: B Natriuretic Peptide: 86 pg/mL (ref 0.0–100.0)

## 2015-04-21 LAB — TROPONIN I: Troponin I: <0.03 ng/mL (ref <0.031)

## 2015-04-21 MED ORDER — ONDANSETRON HCL 4 MG/2ML IJ SOLN
INTRAMUSCULAR | Status: AC
  Filled 2015-04-21: qty 2

## 2015-04-21 MED ORDER — PREDNISONE 10 MG (21) PO TBPK: ORAL_TABLET | ORAL | Status: DC

## 2015-04-21 MED ORDER — ONDANSETRON HCL 4 MG/2ML IJ SOLN
4.0000 mg | Freq: Once | INTRAMUSCULAR | Status: AC
  Administered 2015-04-21: 4 mg via INTRAVENOUS

## 2015-04-21 MED ORDER — IPRATROPIUM BROMIDE 0.02 % IN SOLN
0.5000 mg | Freq: Once | RESPIRATORY_TRACT | Status: AC
  Administered 2015-04-21: 0.5 mg via RESPIRATORY_TRACT
  Filled 2015-04-21: qty 2.5

## 2015-04-21 MED ORDER — ALBUTEROL (5 MG/ML) CONTINUOUS INHALATION SOLN
10.0000 mg/h | INHALATION_SOLUTION | Freq: Once | RESPIRATORY_TRACT | Status: AC
  Administered 2015-04-21: 10 mg/h via RESPIRATORY_TRACT
  Filled 2015-04-21: qty 20

## 2015-04-21 MED ORDER — METHYLPREDNISOLONE SODIUM SUCC 125 MG IJ SOLR
125.0000 mg | Freq: Once | INTRAMUSCULAR | Status: AC
  Administered 2015-04-21: 125 mg via INTRAVENOUS
  Filled 2015-04-21: qty 2

## 2015-04-21 NOTE — ED Notes (Signed)
RT at bedside.

## 2015-04-21 NOTE — ED Notes (Signed)
RT called

## 2015-04-21 NOTE — ED Provider Notes (Signed)
CSN: [id number]     Arrival date & time 04/21/15  7793 History  This chart was scribed for Dorie Rank, MD by Starleen Arms, ED Scribe. This patient was seen in room APA03/APA03 and the patient's care was started at 8:42 AM.   Chief Complaint  Patient presents with  . Shortness of Breath   The history is provided by the patient. No language interpreter was used.   HPI Comments: [name] is a 81 y.o. female with a pmhx of asthma and COPD who presents to the Emergency Department complaining of SOB onset this morning.  She denies any SOB prior to today but reports she did some yard work several days ago, after which she developed a cough.  She uses an albuterol inhaler regularly for asthma, but she has not used it more than normal recently.  Patient is a former smoker.  She denies history of DVT/PE, CHF, MI.  She denies any pain, fever, leg swelling.   Past Medical History  Diagnosis Date  . Essential hypertension, benign   . Paroxysmal atrial fibrillation   . Asthma   . Benign essential tremor   . COPD (chronic obstructive pulmonary disease)    Past Surgical History  Procedure Laterality Date  . Colonoscopy N/A 12/29/2012    Procedure: COLONOSCOPY;  Surgeon: Rogene Houston, MD;  Location: [facility];  Service: Endoscopy;  Laterality: N/A;  12:15  . Breast biopsy Left 10 yrs ago    Dr Marolyn Hammock  . Vaginal hysterectomy N/A 02/07/2013    Procedure: HYSTERECTOMY VAGINAL;  Surgeon: Jonnie Kind, MD;  Location: [facility];  Service: Gynecology;  Laterality: N/A;  . Salpingoophorectomy Bilateral 02/07/2013    Procedure: SALPINGO OOPHORECTOMY;  Surgeon: Jonnie Kind, MD;  Location: [facility];  Service: Gynecology;  Laterality: Bilateral;  . Anterior and posterior repair N/A 02/07/2013    Procedure: ANTERIOR (CYSTOCELE) AND POSTERIOR REPAIR (RECTOCELE);  Surgeon: Jonnie Kind, MD;  Location: [facility];  Service: Gynecology;  Laterality: N/A;  . Cystoscopy N/A 02/07/2013    Procedure:  CYSTOSCOPY;  Surgeon: Jonnie Kind, MD;  Location: [facility];  Service: Gynecology;  Laterality: N/A;   Family History  Problem Relation Age of Onset  . Breast cancer Mother   . Heart attack Father   . Heart disease Father   . Tremor Father   . Diabetes Son   . Cancer Brother   . Tremor Other   . Tremor Brother    History  Substance Use Topics  . Smoking status: Former Smoker -- 1.00 packs/day for 20 years    Types: Cigarettes    Start date: 03/08/1952    Quit date: 01/07/1991  . Smokeless tobacco: Never Used  . Alcohol Use: No   OB History    Gravida Para Term Preterm AB TAB SAB Ectopic Multiple Living   2 2             Review of Systems  Constitutional: Negative for fever.  Respiratory: Positive for cough and shortness of breath.   Cardiovascular: Negative for chest pain and leg swelling.  Gastrointestinal: Negative for abdominal pain.  All other systems reviewed and are negative.     Allergies  Penicillins and Codeine  Home Medications   Prior to Admission medications   Medication Sig Start Date End Date Taking? Authorizing Provider  albuterol (PROVENTIL HFA;VENTOLIN HFA) 108 (90 BASE) MCG/ACT inhaler Inhale 1 puff into the lungs every 6 (six) hours as needed for wheezing or  shortness of breath.   Yes Historical Provider, MD  benazepril-hydrochlorthiazide (LOTENSIN HCT) 10-12.5 MG per tablet Take 0.5 tablets by mouth daily.    Yes Historical Provider, MD  budesonide-formoterol (SYMBICORT) 160-4.5 MCG/ACT inhaler Inhale 2 puffs into the lungs 2 (two) times daily.     Yes Historical Provider, MD  diltiazem (CARDIZEM CD) 240 MG 24 hr capsule Take 240 mg by mouth daily.     Yes Historical Provider, MD  diltiazem (CARDIZEM) 60 MG tablet Please only fill 30 day vs 90 day supply gets wasted as pt rarely takes this prn med For palpitations/pt. is also taking Diltiazem CD 240 mg daily Patient taking differently: Take 60 mg by mouth daily as needed (palpitations). Please  only fill 30 day vs 90 day supply gets wasted as pt rarely takes this prn med For palpitations/pt. is also taking Diltiazem CD 240 mg daily 12/14/14  Yes Satira Sark, MD  fish oil-omega-3 fatty acids 1000 MG capsule Take 1 g by mouth daily.     Yes Historical Provider, MD  Fluticasone-Salmeterol (ADVAIR) 250-50 MCG/DOSE AEPB Inhale 1 puff into the lungs 2 (two) times daily.   Yes Historical Provider, MD  levocetirizine (XYZAL) 5 MG tablet Take 5 mg by mouth daily.     Yes Historical Provider, MD  montelukast (SINGULAIR) 10 MG tablet Take 10 mg by mouth at bedtime.     Yes Historical Provider, MD  Multiple Vitamins-Minerals (MULTIVITAMIN WITH MINERALS) tablet Take 1 tablet by mouth daily.     Yes Historical Provider, MD  rosuvastatin (CRESTOR) 10 MG tablet Take 10 mg by mouth daily.     Yes Historical Provider, MD  topiramate (TOPAMAX) 25 MG tablet Take 25 mg by mouth daily.    Yes Historical Provider, MD  warfarin (COUMADIN) 5 MG tablet Take 2.5 mg by mouth daily.  11/25/11  Yes Renella Cunas, MD  predniSONE (STERAPRED UNI-PAK 21 TAB) 10 MG (21) TBPK tablet Take 6 tabs by mouth daily  for 2 days, then 5 tabs for 2 days, then 4 tabs for 2 days, then 3 tabs for 2 days, 2 tabs for 2 days, then 1 tab by mouth daily for 2 days 04/21/15   Dorie Rank, MD   BP 125/55 mmHg  Pulse 124  Temp(Src) 98.5 F (36.9 C) (Oral)  Resp 18  Ht 5\' 3"  (1.6 m)  Wt 138 lb (62.596 kg)  BMI 24.45 kg/m2  SpO2 94% Physical Exam  Constitutional: No distress.  HENT:  Head: Normocephalic and atraumatic.  Right Ear: External ear normal.  Left Ear: External ear normal.  Eyes: Conjunctivae are normal. Right eye exhibits no discharge. Left eye exhibits no discharge. No scleral icterus.  Neck: Neck supple. No tracheal deviation present.  Cardiovascular: Normal rate, regular rhythm and intact distal pulses.   Pulmonary/Chest: Effort normal. No stridor. Tachypnea noted. No respiratory distress. She has decreased breath sounds.  She has no wheezes. She has rales in the right lower field.  Abdominal: Soft. Bowel sounds are normal. She exhibits no distension. There is no tenderness. There is no rebound and no guarding.  Musculoskeletal: She exhibits no edema or tenderness.  Neurological: She is alert. She has normal strength. No cranial nerve deficit (no facial droop, extraocular movements intact, no slurred speech) or sensory deficit. She exhibits normal muscle tone. She displays no seizure activity. Coordination normal.  Skin: Skin is warm and dry. No rash noted.  Psychiatric: She has a normal mood and affect.  Nursing note  and vitals reviewed.   ED Course  Procedures (including critical care time)  DIAGNOSTIC STUDIES: Oxygen Saturation is 97% on RA, normal by my interpretation.    COORDINATION OF CARE:  8:49 AM Discussed treatment plan with patient at bedside.  Patient acknowledges and agrees with plan.    Labs Review Labs Reviewed  BASIC METABOLIC PANEL - Abnormal; Notable for the following:    Glucose, Bld 116 (*)    Calcium 8.3 (*)    All other components within normal limits  CBC  BRAIN NATRIURETIC PEPTIDE  TROPONIN I    Imaging Review Dg Chest 2 View  04/21/2015   CLINICAL DATA:  Short of breath.  Initial encounter.  EXAM: CHEST  2 VIEW  COMPARISON:  10/22/2011.  FINDINGS: Cardiopericardial silhouette within normal limits. Emphysema. Aortic arch atherosclerosis. Basilar atelectasis and/or scarring. There is a nodular density at the RIGHT lung base, potentially representing a nipple shadow. This measures 11 mm. This was not present on the prior radiograph of 10/22/2011.  Monitoring leads project over the chest. Chronic bronchitic changes.  IMPRESSION: 1. Emphysema without acute cardiopulmonary disease. 2. 11 mm nodular density at the RIGHT lung base potentially representing nipple shadow. Repeat frontal view of the chest with nipple markers recommended.   Electronically Signed   By: Dereck Ligas M.D.    On: 04/21/2015 09:29     EKG Interpretation   Date/Time:  Sunday April 21 2015 08:43:46 EDT Ventricular Rate:  111 PR Interval:  170 QRS Duration: 89 QT Interval:  308 QTC Calculation: 418 R Axis:   20 Text Interpretation:  Sinus tachycardia sinus tachy has replaced a fib  compared to prior EKG Confirmed by Erastus Bartolomei  MD-J, Darline Faith ([id number]) on 04/21/2015  8:59:52 AM     Medications  methylPREDNISolone sodium succinate (SOLU-MEDROL) 125 mg/2 mL injection 125 mg (125 mg Intravenous Given 04/21/15 0915)  albuterol (PROVENTIL,VENTOLIN) solution continuous neb (10 mg/hr Nebulization Given 04/21/15 0942)  ipratropium (ATROVENT) nebulizer solution 0.5 mg (0.5 mg Nebulization Given 04/21/15 0944)  ondansetron (ZOFRAN) injection 4 mg (4 mg Intravenous Given 04/21/15 1053)    MDM   Final diagnoses:  COPD exacerbation    Pt was treated for a COPD exacerbation.  CXR was negative for pneumonia.  Troponin and bnp were normal.  Incidental nodular density possible nipple shadow noted on xray.  Follow up with PCP.  Pt is feeling better after treatment.  Tachycardia related to the neb treatment. She is ready to go home.  Dc home on steroid taper  I personally performed the services described in this documentation, which was scribed in my presence.  The recorded information has been reviewed and is accurate.    Dorie Rank, MD 04/21/15 [phone]

## 2015-04-21 NOTE — Discharge Instructions (Signed)

## 2015-04-21 NOTE — ED Notes (Signed)
Pt reports sob, cough since last night. Pt reports used inhaler this am with no relief. Pt denies any pain. Moderate dyspnea noted with exertion. Pt denies fever.

## 2015-04-21 NOTE — ED Notes (Signed)
Patient c/o nausea d/t neb treatment.

## 2015-04-21 NOTE — ED Notes (Signed)
MD at bedside. 

## 2015-04-29 ENCOUNTER — Ambulatory Visit (INDEPENDENT_AMBULATORY_CARE_PROVIDER_SITE_OTHER): Payer: Medicare Other | Admitting: *Deleted

## 2015-04-29 DIAGNOSIS — Z7901 Long term (current) use of anticoagulants: Secondary | ICD-10-CM | POA: Diagnosis not present

## 2015-04-29 DIAGNOSIS — I4891 Unspecified atrial fibrillation: Secondary | ICD-10-CM

## 2015-04-29 DIAGNOSIS — Z5181 Encounter for therapeutic drug level monitoring: Secondary | ICD-10-CM

## 2015-04-29 LAB — POCT INR: INR: 5

## 2015-05-06 ENCOUNTER — Ambulatory Visit (INDEPENDENT_AMBULATORY_CARE_PROVIDER_SITE_OTHER): Payer: Medicare Other | Admitting: *Deleted

## 2015-05-06 DIAGNOSIS — I4891 Unspecified atrial fibrillation: Secondary | ICD-10-CM

## 2015-05-06 DIAGNOSIS — Z5181 Encounter for therapeutic drug level monitoring: Secondary | ICD-10-CM

## 2015-05-06 DIAGNOSIS — Z7901 Long term (current) use of anticoagulants: Secondary | ICD-10-CM

## 2015-05-06 LAB — POCT INR: INR: 1.3

## 2015-05-13 ENCOUNTER — Ambulatory Visit (INDEPENDENT_AMBULATORY_CARE_PROVIDER_SITE_OTHER): Payer: Medicare Other | Admitting: *Deleted

## 2015-05-13 DIAGNOSIS — Z7901 Long term (current) use of anticoagulants: Secondary | ICD-10-CM | POA: Diagnosis not present

## 2015-05-13 DIAGNOSIS — Z5181 Encounter for therapeutic drug level monitoring: Secondary | ICD-10-CM

## 2015-05-13 DIAGNOSIS — I4891 Unspecified atrial fibrillation: Secondary | ICD-10-CM

## 2015-05-13 LAB — POCT INR: INR: 2.5

## 2015-06-03 ENCOUNTER — Ambulatory Visit (INDEPENDENT_AMBULATORY_CARE_PROVIDER_SITE_OTHER): Payer: Medicare Other | Admitting: *Deleted

## 2015-06-03 DIAGNOSIS — I4891 Unspecified atrial fibrillation: Secondary | ICD-10-CM | POA: Diagnosis not present

## 2015-06-03 DIAGNOSIS — Z5181 Encounter for therapeutic drug level monitoring: Secondary | ICD-10-CM

## 2015-06-03 DIAGNOSIS — Z7901 Long term (current) use of anticoagulants: Secondary | ICD-10-CM

## 2015-06-03 LAB — POCT INR: INR: 2.4

## 2015-07-01 ENCOUNTER — Ambulatory Visit (INDEPENDENT_AMBULATORY_CARE_PROVIDER_SITE_OTHER): Payer: Medicare Other | Admitting: *Deleted

## 2015-07-01 DIAGNOSIS — Z7901 Long term (current) use of anticoagulants: Secondary | ICD-10-CM

## 2015-07-01 DIAGNOSIS — Z5181 Encounter for therapeutic drug level monitoring: Secondary | ICD-10-CM

## 2015-07-01 DIAGNOSIS — I4891 Unspecified atrial fibrillation: Secondary | ICD-10-CM | POA: Diagnosis not present

## 2015-07-01 LAB — POCT INR: INR: 2.6

## 2015-07-18 DIAGNOSIS — Z23 Encounter for immunization: Secondary | ICD-10-CM | POA: Diagnosis not present

## 2015-08-12 ENCOUNTER — Ambulatory Visit (INDEPENDENT_AMBULATORY_CARE_PROVIDER_SITE_OTHER): Payer: Medicare Other | Admitting: *Deleted

## 2015-08-12 DIAGNOSIS — I4891 Unspecified atrial fibrillation: Secondary | ICD-10-CM | POA: Diagnosis not present

## 2015-08-12 DIAGNOSIS — Z5181 Encounter for therapeutic drug level monitoring: Secondary | ICD-10-CM

## 2015-08-12 DIAGNOSIS — Z7901 Long term (current) use of anticoagulants: Secondary | ICD-10-CM

## 2015-08-12 LAB — POCT INR
INR: 3.3
INR: 3.3

## 2015-08-21 ENCOUNTER — Ambulatory Visit (INDEPENDENT_AMBULATORY_CARE_PROVIDER_SITE_OTHER): Payer: Medicare Other | Admitting: *Deleted

## 2015-08-21 DIAGNOSIS — Z7901 Long term (current) use of anticoagulants: Secondary | ICD-10-CM

## 2015-08-21 DIAGNOSIS — Z5181 Encounter for therapeutic drug level monitoring: Secondary | ICD-10-CM | POA: Diagnosis not present

## 2015-08-21 DIAGNOSIS — I4891 Unspecified atrial fibrillation: Secondary | ICD-10-CM | POA: Diagnosis not present

## 2015-08-21 LAB — POCT INR: INR: 2.8

## 2015-09-04 ENCOUNTER — Ambulatory Visit (INDEPENDENT_AMBULATORY_CARE_PROVIDER_SITE_OTHER): Payer: Medicare Other | Admitting: *Deleted

## 2015-09-04 DIAGNOSIS — Z5181 Encounter for therapeutic drug level monitoring: Secondary | ICD-10-CM | POA: Diagnosis not present

## 2015-09-04 DIAGNOSIS — Z7901 Long term (current) use of anticoagulants: Secondary | ICD-10-CM | POA: Diagnosis not present

## 2015-09-04 DIAGNOSIS — I4891 Unspecified atrial fibrillation: Secondary | ICD-10-CM | POA: Diagnosis not present

## 2015-09-04 LAB — POCT INR: INR: 2.2

## 2015-09-25 ENCOUNTER — Other Ambulatory Visit (HOSPITAL_COMMUNITY): Payer: Self-pay | Admitting: Pulmonary Disease

## 2015-09-25 DIAGNOSIS — Z1231 Encounter for screening mammogram for malignant neoplasm of breast: Secondary | ICD-10-CM

## 2015-10-02 ENCOUNTER — Ambulatory Visit (HOSPITAL_COMMUNITY): Payer: Medicare Other

## 2015-10-03 DIAGNOSIS — R251 Tremor, unspecified: Secondary | ICD-10-CM | POA: Diagnosis not present

## 2015-10-03 DIAGNOSIS — I10 Essential (primary) hypertension: Secondary | ICD-10-CM | POA: Diagnosis not present

## 2015-10-03 DIAGNOSIS — J441 Chronic obstructive pulmonary disease with (acute) exacerbation: Secondary | ICD-10-CM | POA: Diagnosis not present

## 2015-10-03 DIAGNOSIS — I482 Chronic atrial fibrillation: Secondary | ICD-10-CM | POA: Diagnosis not present

## 2015-10-07 ENCOUNTER — Ambulatory Visit (INDEPENDENT_AMBULATORY_CARE_PROVIDER_SITE_OTHER): Payer: Medicare Other | Admitting: *Deleted

## 2015-10-07 DIAGNOSIS — I4891 Unspecified atrial fibrillation: Secondary | ICD-10-CM

## 2015-10-07 DIAGNOSIS — Z7901 Long term (current) use of anticoagulants: Secondary | ICD-10-CM | POA: Diagnosis not present

## 2015-10-07 DIAGNOSIS — Z5181 Encounter for therapeutic drug level monitoring: Secondary | ICD-10-CM

## 2015-10-07 LAB — POCT INR: INR: 2

## 2015-10-10 ENCOUNTER — Ambulatory Visit (HOSPITAL_COMMUNITY)
Admission: RE | Admit: 2015-10-10 | Discharge: 2015-10-10 | Disposition: A | Discharge status: Home / Self Care | Payer: Medicare Other | Source: Ambulatory Visit | Attending: Pulmonary Disease | Admitting: Pulmonary Disease

## 2015-10-10 DIAGNOSIS — Z1231 Encounter for screening mammogram for malignant neoplasm of breast: Secondary | ICD-10-CM

## 2015-10-22 DIAGNOSIS — H2513 Age-related nuclear cataract, bilateral: Secondary | ICD-10-CM | POA: Diagnosis not present

## 2015-10-30 DIAGNOSIS — R251 Tremor, unspecified: Secondary | ICD-10-CM | POA: Diagnosis not present

## 2015-10-30 DIAGNOSIS — Z23 Encounter for immunization: Secondary | ICD-10-CM | POA: Diagnosis not present

## 2015-10-30 DIAGNOSIS — I482 Chronic atrial fibrillation: Secondary | ICD-10-CM | POA: Diagnosis not present

## 2015-10-30 DIAGNOSIS — J449 Chronic obstructive pulmonary disease, unspecified: Secondary | ICD-10-CM | POA: Diagnosis not present

## 2015-10-30 DIAGNOSIS — I10 Essential (primary) hypertension: Secondary | ICD-10-CM | POA: Diagnosis not present

## 2015-11-18 ENCOUNTER — Ambulatory Visit (INDEPENDENT_AMBULATORY_CARE_PROVIDER_SITE_OTHER): Payer: Medicare Other | Admitting: *Deleted

## 2015-11-18 DIAGNOSIS — Z7901 Long term (current) use of anticoagulants: Secondary | ICD-10-CM | POA: Diagnosis not present

## 2015-11-18 DIAGNOSIS — Z5181 Encounter for therapeutic drug level monitoring: Secondary | ICD-10-CM | POA: Diagnosis not present

## 2015-11-18 DIAGNOSIS — I4891 Unspecified atrial fibrillation: Secondary | ICD-10-CM

## 2015-11-18 LAB — POCT INR: INR: 2.3

## 2015-11-25 ENCOUNTER — Telehealth: Payer: Self-pay | Admitting: *Deleted

## 2015-11-25 NOTE — Telephone Encounter (Signed)
Patient started Prednisone Saturday, 11/22/15 and wanted to check to see if she needs to adjust her coumadin.  Patient states she will be taking Prednisone for 12 days total

## 2015-11-25 NOTE — Telephone Encounter (Signed)
On prednisone taper. Started 2/3   Will finish 2/14.  Hold coumadin tonight and Friday night (2/10)  Take 1/2 tablet all other days.  Come for INR check on 12/02/15.  Pt verbalized understanding.

## 2015-11-26 ENCOUNTER — Other Ambulatory Visit (HOSPITAL_COMMUNITY): Payer: Self-pay | Admitting: Pulmonary Disease

## 2015-11-26 ENCOUNTER — Ambulatory Visit (HOSPITAL_COMMUNITY)
Admission: RE | Admit: 2015-11-26 | Discharge: 2015-11-26 | Disposition: A | Discharge status: Home / Self Care | Payer: Medicare Other | Source: Ambulatory Visit | Attending: Pulmonary Disease | Admitting: Pulmonary Disease

## 2015-11-26 DIAGNOSIS — R079 Chest pain, unspecified: Secondary | ICD-10-CM | POA: Diagnosis not present

## 2015-11-26 DIAGNOSIS — R918 Other nonspecific abnormal finding of lung field: Secondary | ICD-10-CM | POA: Diagnosis not present

## 2015-11-26 DIAGNOSIS — J441 Chronic obstructive pulmonary disease with (acute) exacerbation: Secondary | ICD-10-CM | POA: Diagnosis not present

## 2015-11-26 DIAGNOSIS — R0989 Other specified symptoms and signs involving the circulatory and respiratory systems: Secondary | ICD-10-CM

## 2015-11-26 DIAGNOSIS — R05 Cough: Secondary | ICD-10-CM

## 2015-11-26 DIAGNOSIS — R059 Cough, unspecified: Secondary | ICD-10-CM

## 2015-11-26 DIAGNOSIS — I482 Chronic atrial fibrillation: Secondary | ICD-10-CM | POA: Diagnosis not present

## 2015-11-26 DIAGNOSIS — I10 Essential (primary) hypertension: Secondary | ICD-10-CM | POA: Diagnosis not present

## 2015-12-02 ENCOUNTER — Ambulatory Visit (INDEPENDENT_AMBULATORY_CARE_PROVIDER_SITE_OTHER): Payer: Medicare Other | Admitting: *Deleted

## 2015-12-02 DIAGNOSIS — I4891 Unspecified atrial fibrillation: Secondary | ICD-10-CM | POA: Diagnosis not present

## 2015-12-02 DIAGNOSIS — Z7901 Long term (current) use of anticoagulants: Secondary | ICD-10-CM | POA: Diagnosis not present

## 2015-12-02 DIAGNOSIS — Z5181 Encounter for therapeutic drug level monitoring: Secondary | ICD-10-CM | POA: Diagnosis not present

## 2015-12-02 LAB — POCT INR: INR: 2.5

## 2015-12-23 ENCOUNTER — Ambulatory Visit (INDEPENDENT_AMBULATORY_CARE_PROVIDER_SITE_OTHER): Payer: Medicare Other | Admitting: *Deleted

## 2015-12-23 DIAGNOSIS — Z7901 Long term (current) use of anticoagulants: Secondary | ICD-10-CM

## 2015-12-23 DIAGNOSIS — I4891 Unspecified atrial fibrillation: Secondary | ICD-10-CM

## 2015-12-23 DIAGNOSIS — Z5181 Encounter for therapeutic drug level monitoring: Secondary | ICD-10-CM

## 2015-12-23 LAB — POCT INR: INR: 2.9

## 2015-12-31 ENCOUNTER — Encounter (INDEPENDENT_AMBULATORY_CARE_PROVIDER_SITE_OTHER): Payer: Self-pay | Admitting: *Deleted

## 2016-01-02 ENCOUNTER — Ambulatory Visit (INDEPENDENT_AMBULATORY_CARE_PROVIDER_SITE_OTHER): Payer: Medicare Other | Admitting: Cardiology

## 2016-01-02 ENCOUNTER — Encounter: Payer: Self-pay | Admitting: Cardiology

## 2016-01-02 VITALS — BP 120/72 | HR 77 | Ht 63.0 in | Wt 137.0 lb

## 2016-01-02 DIAGNOSIS — I480 Paroxysmal atrial fibrillation: Secondary | ICD-10-CM

## 2016-01-02 DIAGNOSIS — I10 Essential (primary) hypertension: Secondary | ICD-10-CM | POA: Diagnosis not present

## 2016-01-02 NOTE — Progress Notes (Signed)
Cardiology Office Note  Date: 01/02/2016   ID: [name], DOB 1934/01/19, MRN [id number]  PCP: Alonza Bogus, MD  Primary Cardiologist: Rozann Lesches, MD   Chief Complaint  Patient presents with  . PAF    History of Present Illness: [name] is a 82 y.o. female last seen in February 2016. She presents for a routine follow-up visit. Continues to do very well in terms of her PAF, no significant palpitations or requirement to use short acting Cardizem. She is primary caregiver for her husband who has chronic declining medical illness.  She continues on Coumadin with follow-up in the anticoagulation clinic. Recent INR 2.9. No spontaneous bleeding problems.  I reviewed her medications which are outlined below. Cardiac regimen is stable.  Past Medical History  Diagnosis Date  . Essential hypertension, benign   . Paroxysmal atrial fibrillation (HCC)   . Asthma   . Benign essential tremor   . COPD (chronic obstructive pulmonary disease) ([place])     Current Outpatient Prescriptions  Medication Sig Dispense Refill  . albuterol (PROVENTIL HFA;VENTOLIN HFA) 108 (90 BASE) MCG/ACT inhaler Inhale 1 puff into the lungs every 6 (six) hours as needed for wheezing or shortness of breath.    . benazepril-hydrochlorthiazide (LOTENSIN HCT) 10-12.5 MG per tablet Take 0.5 tablets by mouth daily.     . budesonide-formoterol (SYMBICORT) 160-4.5 MCG/ACT inhaler Inhale 2 puffs into the lungs 2 (two) times daily.      Marland Kitchen diltiazem (CARDIZEM CD) 240 MG 24 hr capsule Take 240 mg by mouth daily.      Marland Kitchen diltiazem (CARDIZEM) 60 MG tablet Please only fill 30 day vs 90 day supply gets wasted as pt rarely takes this prn med For palpitations/pt. is also taking Diltiazem CD 240 mg daily (Patient taking differently: Take 60 mg by mouth daily as needed (palpitations). Please only fill 30 day vs 90 day supply gets wasted as pt rarely takes this prn med For palpitations/pt. is also taking Diltiazem CD  240 mg daily) 30 tablet 11  . fish oil-omega-3 fatty acids 1000 MG capsule Take 1 g by mouth daily.      . Fluticasone-Salmeterol (ADVAIR) 250-50 MCG/DOSE AEPB Inhale 1 puff into the lungs 2 (two) times daily.    Marland Kitchen levocetirizine (XYZAL) 5 MG tablet Take 5 mg by mouth daily.      . montelukast (SINGULAIR) 10 MG tablet Take 10 mg by mouth at bedtime.      . Multiple Vitamins-Minerals (MULTIVITAMIN WITH MINERALS) tablet Take 1 tablet by mouth daily.      . rosuvastatin (CRESTOR) 10 MG tablet Take 10 mg by mouth daily.      Marland Kitchen topiramate (TOPAMAX) 25 MG tablet Take 25 mg by mouth daily.     Marland Kitchen warfarin (COUMADIN) 5 MG tablet Take 2.5 mg by mouth daily.      No current facility-administered medications for this visit.   Allergies:  Penicillins and Codeine   Social History: The patient  reports that she quit smoking about 25 years ago. Her smoking use included Cigarettes. She started smoking about 63 years ago. She has a 20 pack-year smoking history. She has never used smokeless tobacco. She reports that she does not drink alcohol or use illicit drugs.   ROS:  Please see the history of present illness. Otherwise, complete review of systems is positive for tremor.  All other systems are reviewed and negative.   Physical Exam: VS:  BP 120/72 mmHg  Pulse 77  Ht 5\' 3"  (1.6 m)  Wt 137 lb (62.143 kg)  BMI 24.27 kg/m2  SpO2 94%, BMI Body mass index is 24.27 kg/(m^2).  Wt Readings from Last 3 Encounters:  01/02/16 137 lb (62.143 kg)  04/21/15 138 lb (62.596 kg)  12/14/14 145 lb (65.772 kg)    Appears comfortable at rest. HEENT: Conjunctiva and lids normal, oropharynx clear. Neck: Supple, no elevated JVP or carotid bruits, no thyromegaly. Lungs: Clear to auscultation, nonlabored breathing at rest. Cardiac: Regular rate and rhythm, no S3 or significant systolic murmur, no pericardial rub. Extremities: No pitting edema, distal pulses 2+. Neuropsychiatric: Alert and oriented x3, affect grossly  appropriate.  ECG: I personally reviewed the prior tracing from 04/21/2015 which showed sinus tachycardia.  Recent Labwork: 04/21/2015: B Natriuretic Peptide 86.0; BUN 12; Creatinine, Ser 0.85; Hemoglobin 12.9; Platelets 220; Potassium 3.8; Sodium 135   Assessment and Plan:  1. Paroxysmal atrial fibrillation, symptomatically stable on current medical regimen which includes Cardizem CD and Coumadin. She continues to follow in the anticoagulation clinic. No significant breakthrough palpitations.  2. Essential hypertension, blood pressure is well controlled today. No changes were made.  Current medicines were reviewed with the patient today.  Disposition: FU with me in 1 year.   Signed, Satira Sark, MD, [facility] 01/02/2016 1:59 PM    [place] [facility] [address], [place], [place] [address] Phone: [phone]; Fax: [phone]

## 2016-01-02 NOTE — Patient Instructions (Signed)

## 2016-01-03 DIAGNOSIS — J301 Allergic rhinitis due to pollen: Secondary | ICD-10-CM | POA: Diagnosis not present

## 2016-01-03 DIAGNOSIS — J4530 Mild persistent asthma, uncomplicated: Secondary | ICD-10-CM | POA: Diagnosis not present

## 2016-01-03 DIAGNOSIS — H1045 Other chronic allergic conjunctivitis: Secondary | ICD-10-CM | POA: Diagnosis not present

## 2016-01-03 DIAGNOSIS — J3089 Other allergic rhinitis: Secondary | ICD-10-CM | POA: Diagnosis not present

## 2016-01-07 DIAGNOSIS — M79675 Pain in left toe(s): Secondary | ICD-10-CM | POA: Diagnosis not present

## 2016-01-07 DIAGNOSIS — L600 Ingrowing nail: Secondary | ICD-10-CM | POA: Diagnosis not present

## 2016-01-07 DIAGNOSIS — M79672 Pain in left foot: Secondary | ICD-10-CM | POA: Diagnosis not present

## 2016-01-07 DIAGNOSIS — L03032 Cellulitis of left toe: Secondary | ICD-10-CM | POA: Diagnosis not present

## 2016-01-27 DIAGNOSIS — J449 Chronic obstructive pulmonary disease, unspecified: Secondary | ICD-10-CM | POA: Diagnosis not present

## 2016-01-27 DIAGNOSIS — R251 Tremor, unspecified: Secondary | ICD-10-CM | POA: Diagnosis not present

## 2016-01-27 DIAGNOSIS — I10 Essential (primary) hypertension: Secondary | ICD-10-CM | POA: Diagnosis not present

## 2016-01-27 DIAGNOSIS — I482 Chronic atrial fibrillation: Secondary | ICD-10-CM | POA: Diagnosis not present

## 2016-02-03 ENCOUNTER — Ambulatory Visit (INDEPENDENT_AMBULATORY_CARE_PROVIDER_SITE_OTHER): Payer: Medicare Other | Admitting: *Deleted

## 2016-02-03 DIAGNOSIS — Z7901 Long term (current) use of anticoagulants: Secondary | ICD-10-CM

## 2016-02-03 DIAGNOSIS — I4891 Unspecified atrial fibrillation: Secondary | ICD-10-CM

## 2016-02-03 DIAGNOSIS — Z5181 Encounter for therapeutic drug level monitoring: Secondary | ICD-10-CM | POA: Diagnosis not present

## 2016-02-03 LAB — POCT INR: INR: 2.6

## 2016-03-18 ENCOUNTER — Ambulatory Visit (INDEPENDENT_AMBULATORY_CARE_PROVIDER_SITE_OTHER): Payer: Medicare Other | Admitting: *Deleted

## 2016-03-18 DIAGNOSIS — Z5181 Encounter for therapeutic drug level monitoring: Secondary | ICD-10-CM | POA: Diagnosis not present

## 2016-03-18 DIAGNOSIS — Z7901 Long term (current) use of anticoagulants: Secondary | ICD-10-CM | POA: Diagnosis not present

## 2016-03-18 DIAGNOSIS — I4891 Unspecified atrial fibrillation: Secondary | ICD-10-CM | POA: Diagnosis not present

## 2016-03-18 LAB — POCT INR: INR: 2.5

## 2016-04-29 ENCOUNTER — Ambulatory Visit (INDEPENDENT_AMBULATORY_CARE_PROVIDER_SITE_OTHER): Payer: Medicare Other | Admitting: Pharmacist

## 2016-04-29 DIAGNOSIS — I4891 Unspecified atrial fibrillation: Secondary | ICD-10-CM | POA: Diagnosis not present

## 2016-04-29 DIAGNOSIS — Z5181 Encounter for therapeutic drug level monitoring: Secondary | ICD-10-CM

## 2016-04-29 DIAGNOSIS — Z7901 Long term (current) use of anticoagulants: Secondary | ICD-10-CM

## 2016-04-29 LAB — POCT INR: INR: 2.7

## 2016-05-04 DIAGNOSIS — J449 Chronic obstructive pulmonary disease, unspecified: Secondary | ICD-10-CM | POA: Diagnosis not present

## 2016-05-04 DIAGNOSIS — I10 Essential (primary) hypertension: Secondary | ICD-10-CM | POA: Diagnosis not present

## 2016-05-04 DIAGNOSIS — M545 Low back pain: Secondary | ICD-10-CM | POA: Diagnosis not present

## 2016-05-27 DIAGNOSIS — R21 Rash and other nonspecific skin eruption: Secondary | ICD-10-CM | POA: Diagnosis not present

## 2016-05-27 DIAGNOSIS — I10 Essential (primary) hypertension: Secondary | ICD-10-CM | POA: Diagnosis not present

## 2016-05-27 DIAGNOSIS — J449 Chronic obstructive pulmonary disease, unspecified: Secondary | ICD-10-CM | POA: Diagnosis not present

## 2016-05-27 DIAGNOSIS — I482 Chronic atrial fibrillation: Secondary | ICD-10-CM | POA: Diagnosis not present

## 2016-06-08 ENCOUNTER — Ambulatory Visit (INDEPENDENT_AMBULATORY_CARE_PROVIDER_SITE_OTHER): Payer: Medicare Other | Admitting: *Deleted

## 2016-06-08 DIAGNOSIS — Z5181 Encounter for therapeutic drug level monitoring: Secondary | ICD-10-CM | POA: Diagnosis not present

## 2016-06-08 DIAGNOSIS — I4891 Unspecified atrial fibrillation: Secondary | ICD-10-CM

## 2016-06-08 DIAGNOSIS — Z7901 Long term (current) use of anticoagulants: Secondary | ICD-10-CM | POA: Diagnosis not present

## 2016-06-08 LAB — POCT INR: INR: 3.3

## 2016-07-06 ENCOUNTER — Ambulatory Visit (INDEPENDENT_AMBULATORY_CARE_PROVIDER_SITE_OTHER): Payer: Medicare Other | Admitting: *Deleted

## 2016-07-06 DIAGNOSIS — I4891 Unspecified atrial fibrillation: Secondary | ICD-10-CM

## 2016-07-06 DIAGNOSIS — Z5181 Encounter for therapeutic drug level monitoring: Secondary | ICD-10-CM | POA: Diagnosis not present

## 2016-07-06 DIAGNOSIS — Z7901 Long term (current) use of anticoagulants: Secondary | ICD-10-CM

## 2016-07-06 LAB — POCT INR: INR: 2.4

## 2016-07-31 DIAGNOSIS — Z23 Encounter for immunization: Secondary | ICD-10-CM | POA: Diagnosis not present

## 2016-08-12 ENCOUNTER — Ambulatory Visit (INDEPENDENT_AMBULATORY_CARE_PROVIDER_SITE_OTHER): Payer: Medicare Other | Admitting: *Deleted

## 2016-08-12 DIAGNOSIS — I4891 Unspecified atrial fibrillation: Secondary | ICD-10-CM | POA: Diagnosis not present

## 2016-08-12 DIAGNOSIS — Z7901 Long term (current) use of anticoagulants: Secondary | ICD-10-CM | POA: Diagnosis not present

## 2016-08-12 DIAGNOSIS — Z5181 Encounter for therapeutic drug level monitoring: Secondary | ICD-10-CM

## 2016-08-12 LAB — POCT INR: INR: 2.2

## 2016-09-16 ENCOUNTER — Ambulatory Visit (INDEPENDENT_AMBULATORY_CARE_PROVIDER_SITE_OTHER): Payer: Medicare Other | Admitting: *Deleted

## 2016-09-16 DIAGNOSIS — I4891 Unspecified atrial fibrillation: Secondary | ICD-10-CM

## 2016-09-16 DIAGNOSIS — Z5181 Encounter for therapeutic drug level monitoring: Secondary | ICD-10-CM

## 2016-09-16 DIAGNOSIS — Z7901 Long term (current) use of anticoagulants: Secondary | ICD-10-CM

## 2016-09-16 LAB — POCT INR: INR: 1.9

## 2016-09-23 ENCOUNTER — Telehealth: Payer: Self-pay | Admitting: *Deleted

## 2016-09-23 NOTE — Telephone Encounter (Signed)
Pt called.  Was bitten by some type of bug on hand.  Saw PCP.  Was started on prednisone 4mg  taper (24mg , 20mg , 16mg , 12mg ,8mg ,4mg ).  Told pt to hold coumadin today and Sat. 12/9 and continue 2.5mg  all other days.  Sunday 12/10 she will resume 2.5mg  daily. Pt verbalized understanding.

## 2016-09-30 DIAGNOSIS — R413 Other amnesia: Secondary | ICD-10-CM | POA: Diagnosis not present

## 2016-09-30 DIAGNOSIS — I4891 Unspecified atrial fibrillation: Secondary | ICD-10-CM | POA: Diagnosis not present

## 2016-09-30 DIAGNOSIS — J449 Chronic obstructive pulmonary disease, unspecified: Secondary | ICD-10-CM | POA: Diagnosis not present

## 2016-09-30 DIAGNOSIS — I10 Essential (primary) hypertension: Secondary | ICD-10-CM | POA: Diagnosis not present

## 2016-10-14 DIAGNOSIS — I482 Chronic atrial fibrillation: Secondary | ICD-10-CM | POA: Diagnosis not present

## 2016-10-14 DIAGNOSIS — J441 Chronic obstructive pulmonary disease with (acute) exacerbation: Secondary | ICD-10-CM | POA: Diagnosis not present

## 2016-10-14 DIAGNOSIS — I10 Essential (primary) hypertension: Secondary | ICD-10-CM | POA: Diagnosis not present

## 2016-10-26 ENCOUNTER — Ambulatory Visit (INDEPENDENT_AMBULATORY_CARE_PROVIDER_SITE_OTHER): Payer: Medicare Other | Admitting: *Deleted

## 2016-10-26 DIAGNOSIS — Z7901 Long term (current) use of anticoagulants: Secondary | ICD-10-CM | POA: Diagnosis not present

## 2016-10-26 DIAGNOSIS — Z5181 Encounter for therapeutic drug level monitoring: Secondary | ICD-10-CM | POA: Diagnosis not present

## 2016-10-26 DIAGNOSIS — I4891 Unspecified atrial fibrillation: Secondary | ICD-10-CM | POA: Diagnosis not present

## 2016-10-26 LAB — POCT INR: INR: 1.7

## 2016-10-30 ENCOUNTER — Institutional Professional Consult (permissible substitution): Payer: Medicare Other | Admitting: Neurology

## 2016-11-16 ENCOUNTER — Ambulatory Visit (INDEPENDENT_AMBULATORY_CARE_PROVIDER_SITE_OTHER): Payer: Medicare Other | Admitting: *Deleted

## 2016-11-16 DIAGNOSIS — Z7901 Long term (current) use of anticoagulants: Secondary | ICD-10-CM | POA: Diagnosis not present

## 2016-11-16 DIAGNOSIS — Z5181 Encounter for therapeutic drug level monitoring: Secondary | ICD-10-CM

## 2016-11-16 DIAGNOSIS — I4891 Unspecified atrial fibrillation: Secondary | ICD-10-CM | POA: Diagnosis not present

## 2016-11-16 LAB — POCT INR: INR: 2

## 2016-11-25 ENCOUNTER — Other Ambulatory Visit (HOSPITAL_COMMUNITY): Payer: Self-pay | Admitting: Pulmonary Disease

## 2016-11-25 DIAGNOSIS — Z1231 Encounter for screening mammogram for malignant neoplasm of breast: Secondary | ICD-10-CM

## 2016-11-30 ENCOUNTER — Ambulatory Visit (HOSPITAL_COMMUNITY)
Admission: RE | Admit: 2016-11-30 | Discharge: 2016-11-30 | Disposition: A | Discharge status: Home / Self Care | Payer: Medicare Other | Source: Ambulatory Visit | Attending: Pulmonary Disease | Admitting: Pulmonary Disease

## 2016-11-30 DIAGNOSIS — Z1231 Encounter for screening mammogram for malignant neoplasm of breast: Secondary | ICD-10-CM | POA: Insufficient documentation

## 2016-12-09 ENCOUNTER — Ambulatory Visit (INDEPENDENT_AMBULATORY_CARE_PROVIDER_SITE_OTHER): Payer: Medicare Other | Admitting: *Deleted

## 2016-12-09 DIAGNOSIS — I4891 Unspecified atrial fibrillation: Secondary | ICD-10-CM | POA: Diagnosis not present

## 2016-12-09 DIAGNOSIS — Z5181 Encounter for therapeutic drug level monitoring: Secondary | ICD-10-CM

## 2016-12-09 DIAGNOSIS — Z7901 Long term (current) use of anticoagulants: Secondary | ICD-10-CM | POA: Diagnosis not present

## 2016-12-09 LAB — POCT INR: INR: 3.6

## 2016-12-30 ENCOUNTER — Ambulatory Visit (INDEPENDENT_AMBULATORY_CARE_PROVIDER_SITE_OTHER): Payer: Medicare Other | Admitting: *Deleted

## 2016-12-30 DIAGNOSIS — Z5181 Encounter for therapeutic drug level monitoring: Secondary | ICD-10-CM | POA: Diagnosis not present

## 2016-12-30 DIAGNOSIS — Z7901 Long term (current) use of anticoagulants: Secondary | ICD-10-CM

## 2016-12-30 DIAGNOSIS — I4891 Unspecified atrial fibrillation: Secondary | ICD-10-CM

## 2016-12-30 LAB — POCT INR: INR: 4.2

## 2017-01-01 DIAGNOSIS — J3089 Other allergic rhinitis: Secondary | ICD-10-CM | POA: Diagnosis not present

## 2017-01-01 DIAGNOSIS — J4530 Mild persistent asthma, uncomplicated: Secondary | ICD-10-CM | POA: Diagnosis not present

## 2017-01-01 DIAGNOSIS — J301 Allergic rhinitis due to pollen: Secondary | ICD-10-CM | POA: Diagnosis not present

## 2017-01-01 DIAGNOSIS — H1045 Other chronic allergic conjunctivitis: Secondary | ICD-10-CM | POA: Diagnosis not present

## 2017-01-06 ENCOUNTER — Ambulatory Visit (INDEPENDENT_AMBULATORY_CARE_PROVIDER_SITE_OTHER): Payer: Medicare Other | Admitting: Cardiology

## 2017-01-06 ENCOUNTER — Encounter: Payer: Self-pay | Admitting: Cardiology

## 2017-01-06 VITALS — BP 126/58 | HR 64 | Ht 63.0 in | Wt 142.0 lb

## 2017-01-06 DIAGNOSIS — I10 Essential (primary) hypertension: Secondary | ICD-10-CM | POA: Diagnosis not present

## 2017-01-06 DIAGNOSIS — I480 Paroxysmal atrial fibrillation: Secondary | ICD-10-CM

## 2017-01-06 NOTE — Patient Instructions (Signed)

## 2017-01-06 NOTE — Progress Notes (Signed)
Cardiology Office Note  Date: 01/06/2017   ID: [name], DOB 01-22-1934, MRN [id number]  PCP: Donna Bogus, MD  Primary Cardiologist: Donna Lesches, MD   Chief Complaint  Patient presents with  . Paroxysmal atrial fibrillation    History of Present Illness: [name] is an 83 y.o. female last seen in March 2017. She presents for a routine follow-up visit today with her husband. Over the last year she does not report any significant palpitations to require use of short acting Cardizem. She reports no exertional chest pain or unusual shortness of breath.  She continues to follow in the anticoagulation clinic on Coumadin. She reports no bleeding problems.  Current cardiac regimen includes Cardizem CD 240 mg daily with as needed use of short acting Cardizem 60 mg daily.  Past Medical History:  Diagnosis Date  . Asthma   . Benign essential tremor   . COPD (chronic obstructive pulmonary disease) ([place])   . Essential hypertension, benign   . Paroxysmal atrial fibrillation [facility])     Current Outpatient Prescriptions  Medication Sig Dispense Refill  . benazepril-hydrochlorthiazide (LOTENSIN HCT) 10-12.5 MG per tablet Take 0.5 tablets by mouth daily.     . budesonide-formoterol (SYMBICORT) 160-4.5 MCG/ACT inhaler Inhale 2 puffs into the lungs 2 (two) times daily.      Marland Kitchen diltiazem (CARDIZEM CD) 240 MG 24 hr capsule Take 240 mg by mouth daily.      Marland Kitchen diltiazem (CARDIZEM) 60 MG tablet Please only fill 30 day vs 90 day supply gets wasted as pt rarely takes this prn med For palpitations/pt. is also taking Diltiazem CD 240 mg daily (Patient taking differently: Take 60 mg by mouth daily as needed (palpitations). Please only fill 30 day vs 90 day supply gets wasted as pt rarely takes this prn med For palpitations/pt. is also taking Diltiazem CD 240 mg daily) 30 tablet 11  . fish oil-omega-3 fatty acids 1000 MG capsule Take 1 g by mouth daily.      . Fluticasone-Salmeterol  (ADVAIR) 250-50 MCG/DOSE AEPB Inhale 1 puff into the lungs 2 (two) times daily.    Marland Kitchen levocetirizine (XYZAL) 5 MG tablet Take 5 mg by mouth daily.      . montelukast (SINGULAIR) 10 MG tablet Take 10 mg by mouth at bedtime.      . Multiple Vitamins-Minerals (MULTIVITAMIN WITH MINERALS) tablet Take 1 tablet by mouth daily.      Marland Kitchen MYRBETRIQ 25 MG TB24 tablet Take 25 mg by mouth daily.     . rosuvastatin (CRESTOR) 10 MG tablet Take 10 mg by mouth daily.      Marland Kitchen topiramate (TOPAMAX) 25 MG tablet Take 25 mg by mouth daily.     Marland Kitchen warfarin (COUMADIN) 5 MG tablet Take 2.5 mg by mouth daily.      No current facility-administered medications for this visit.    Allergies:  Penicillins and Codeine   Social History: The patient  reports that she quit smoking about 26 years ago. Her smoking use included Cigarettes. She started smoking about 64 years ago. She has a 20.00 pack-year smoking history. She has never used smokeless tobacco. She reports that she does not drink alcohol or use drugs.   ROS:  Please see the history of present illness. Otherwise, complete review of systems is positive for none.  All other systems are reviewed and negative.   Physical Exam: VS:  BP (!) 126/58   Pulse 64   Ht 5\' 3"  (1.6 m)  Wt 142 lb (64.4 kg)   SpO2 96%   BMI 25.15 kg/m , BMI Body mass index is 25.15 kg/m.  Wt Readings from Last 3 Encounters:  01/06/17 142 lb (64.4 kg)  01/02/16 137 lb (62.1 kg)  04/21/15 138 lb (62.6 kg)    Appears comfortable at rest. HEENT: Conjunctiva and lids normal, oropharynx clear. Neck: Supple, no elevated JVP or carotid bruits, no thyromegaly. Lungs: Clear to auscultation, nonlabored breathing at rest. Cardiac: Regular rate and rhythm, no S3 or significant systolic murmur, no pericardial rub. Extremities: No pitting edema, distal pulses 2+. Neuropsychiatric: Alert and oriented x3, affect grossly appropriate.  ECG: I personally reviewed the tracing from 04/21/2015 which showed  sinus tachycardia.  Other Studies Reviewed Today:  Chest x-ray 11/26/2015: FINDINGS: Stable cardiac and mediastinal contours. No consolidative pulmonary opacities. Stable biapical pleural parenchymal thickening. No pleural effusion or pneumothorax. Lungs are hyperinflated. Aortic vascular calcifications. Regional skeleton is unremarkable.  IMPRESSION: Pulmonary hyperinflation.  No acute cardiopulmonary process.  Assessment and Plan:  1. Paroxysmal atrial fibrillation, continues to do well without any significant sense of palpitations on Cardizem CD. She also remains on Coumadin for stroke prophylaxis with follow-up in the anticoagulation clinic. No changes were made today.  2. Essential hypertension, blood pressure is adequately controlled today. She is also on Lotensin HCT. Keep follow with Dr. Luan Bowman.  Current medicines were reviewed with the patient today.   Orders Placed This Encounter  Procedures  . EKG 12-Lead    Disposition: Follow-up in one year.  Signed, Donna Sark, MD, [facility] 01/06/2017 1:47 PM    [facility] at [facility] [address] [place], [place] [address] Phone: [phone]; Fax: [phone]

## 2017-01-18 ENCOUNTER — Ambulatory Visit (INDEPENDENT_AMBULATORY_CARE_PROVIDER_SITE_OTHER): Payer: Medicare Other | Admitting: *Deleted

## 2017-01-18 DIAGNOSIS — Z5181 Encounter for therapeutic drug level monitoring: Secondary | ICD-10-CM

## 2017-01-18 DIAGNOSIS — I4891 Unspecified atrial fibrillation: Secondary | ICD-10-CM | POA: Diagnosis not present

## 2017-01-18 DIAGNOSIS — Z7901 Long term (current) use of anticoagulants: Secondary | ICD-10-CM | POA: Diagnosis not present

## 2017-01-18 LAB — POCT INR: INR: 2.4

## 2017-02-15 ENCOUNTER — Ambulatory Visit (INDEPENDENT_AMBULATORY_CARE_PROVIDER_SITE_OTHER): Payer: Medicare Other | Admitting: *Deleted

## 2017-02-15 DIAGNOSIS — Z7901 Long term (current) use of anticoagulants: Secondary | ICD-10-CM

## 2017-02-15 DIAGNOSIS — I4891 Unspecified atrial fibrillation: Secondary | ICD-10-CM | POA: Diagnosis not present

## 2017-02-15 DIAGNOSIS — Z5181 Encounter for therapeutic drug level monitoring: Secondary | ICD-10-CM | POA: Diagnosis not present

## 2017-02-15 LAB — POCT INR: INR: 2.7

## 2017-03-17 ENCOUNTER — Ambulatory Visit (INDEPENDENT_AMBULATORY_CARE_PROVIDER_SITE_OTHER): Payer: Medicare Other | Admitting: *Deleted

## 2017-03-17 DIAGNOSIS — I4891 Unspecified atrial fibrillation: Secondary | ICD-10-CM

## 2017-03-17 DIAGNOSIS — Z7901 Long term (current) use of anticoagulants: Secondary | ICD-10-CM | POA: Diagnosis not present

## 2017-03-17 DIAGNOSIS — Z5181 Encounter for therapeutic drug level monitoring: Secondary | ICD-10-CM

## 2017-03-17 LAB — POCT INR: INR: 1.9

## 2017-04-07 ENCOUNTER — Ambulatory Visit (INDEPENDENT_AMBULATORY_CARE_PROVIDER_SITE_OTHER): Payer: Medicare Other | Admitting: *Deleted

## 2017-04-07 DIAGNOSIS — Z7901 Long term (current) use of anticoagulants: Secondary | ICD-10-CM | POA: Diagnosis not present

## 2017-04-07 DIAGNOSIS — Z5181 Encounter for therapeutic drug level monitoring: Secondary | ICD-10-CM

## 2017-04-07 DIAGNOSIS — I4891 Unspecified atrial fibrillation: Secondary | ICD-10-CM | POA: Diagnosis not present

## 2017-04-07 LAB — POCT INR: INR: 2.3

## 2017-05-03 DIAGNOSIS — I482 Chronic atrial fibrillation: Secondary | ICD-10-CM | POA: Diagnosis not present

## 2017-05-03 DIAGNOSIS — J449 Chronic obstructive pulmonary disease, unspecified: Secondary | ICD-10-CM | POA: Diagnosis not present

## 2017-05-03 DIAGNOSIS — I10 Essential (primary) hypertension: Secondary | ICD-10-CM | POA: Diagnosis not present

## 2017-05-17 ENCOUNTER — Ambulatory Visit (INDEPENDENT_AMBULATORY_CARE_PROVIDER_SITE_OTHER): Payer: Medicare Other | Admitting: *Deleted

## 2017-05-17 DIAGNOSIS — Z7901 Long term (current) use of anticoagulants: Secondary | ICD-10-CM

## 2017-05-17 DIAGNOSIS — I4891 Unspecified atrial fibrillation: Secondary | ICD-10-CM

## 2017-05-17 DIAGNOSIS — Z5181 Encounter for therapeutic drug level monitoring: Secondary | ICD-10-CM | POA: Diagnosis not present

## 2017-05-17 LAB — POCT INR: INR: 2.2

## 2017-06-28 ENCOUNTER — Ambulatory Visit (INDEPENDENT_AMBULATORY_CARE_PROVIDER_SITE_OTHER): Payer: Medicare Other | Admitting: *Deleted

## 2017-06-28 DIAGNOSIS — Z7901 Long term (current) use of anticoagulants: Secondary | ICD-10-CM | POA: Diagnosis not present

## 2017-06-28 DIAGNOSIS — Z5181 Encounter for therapeutic drug level monitoring: Secondary | ICD-10-CM

## 2017-06-28 DIAGNOSIS — I4891 Unspecified atrial fibrillation: Secondary | ICD-10-CM | POA: Diagnosis not present

## 2017-06-28 LAB — POCT INR: INR: 3.7

## 2017-07-19 ENCOUNTER — Ambulatory Visit (INDEPENDENT_AMBULATORY_CARE_PROVIDER_SITE_OTHER): Payer: Medicare Other | Admitting: *Deleted

## 2017-07-19 DIAGNOSIS — I4891 Unspecified atrial fibrillation: Secondary | ICD-10-CM | POA: Diagnosis not present

## 2017-07-19 DIAGNOSIS — Z5181 Encounter for therapeutic drug level monitoring: Secondary | ICD-10-CM

## 2017-07-19 DIAGNOSIS — Z7901 Long term (current) use of anticoagulants: Secondary | ICD-10-CM

## 2017-07-19 LAB — POCT INR: INR: 2.6

## 2017-08-04 DIAGNOSIS — I10 Essential (primary) hypertension: Secondary | ICD-10-CM | POA: Diagnosis not present

## 2017-08-04 DIAGNOSIS — Z23 Encounter for immunization: Secondary | ICD-10-CM | POA: Diagnosis not present

## 2017-08-04 DIAGNOSIS — I482 Chronic atrial fibrillation: Secondary | ICD-10-CM | POA: Diagnosis not present

## 2017-08-04 DIAGNOSIS — J449 Chronic obstructive pulmonary disease, unspecified: Secondary | ICD-10-CM | POA: Diagnosis not present

## 2017-08-04 DIAGNOSIS — R251 Tremor, unspecified: Secondary | ICD-10-CM | POA: Diagnosis not present

## 2017-08-16 ENCOUNTER — Ambulatory Visit (INDEPENDENT_AMBULATORY_CARE_PROVIDER_SITE_OTHER): Payer: Medicare Other | Admitting: *Deleted

## 2017-08-16 DIAGNOSIS — Z7901 Long term (current) use of anticoagulants: Secondary | ICD-10-CM | POA: Diagnosis not present

## 2017-08-16 DIAGNOSIS — I4891 Unspecified atrial fibrillation: Secondary | ICD-10-CM

## 2017-08-16 DIAGNOSIS — Z5181 Encounter for therapeutic drug level monitoring: Secondary | ICD-10-CM

## 2017-08-16 LAB — POCT INR: INR: 3.5

## 2017-09-06 ENCOUNTER — Ambulatory Visit (INDEPENDENT_AMBULATORY_CARE_PROVIDER_SITE_OTHER): Payer: Medicare Other | Admitting: *Deleted

## 2017-09-06 DIAGNOSIS — Z7901 Long term (current) use of anticoagulants: Secondary | ICD-10-CM

## 2017-09-06 DIAGNOSIS — Z5181 Encounter for therapeutic drug level monitoring: Secondary | ICD-10-CM

## 2017-09-06 DIAGNOSIS — I4891 Unspecified atrial fibrillation: Secondary | ICD-10-CM | POA: Diagnosis not present

## 2017-09-06 LAB — POCT INR: INR: 2.2

## 2017-10-04 ENCOUNTER — Ambulatory Visit (INDEPENDENT_AMBULATORY_CARE_PROVIDER_SITE_OTHER): Payer: Medicare Other | Admitting: *Deleted

## 2017-10-04 DIAGNOSIS — I4891 Unspecified atrial fibrillation: Secondary | ICD-10-CM

## 2017-10-04 DIAGNOSIS — Z7901 Long term (current) use of anticoagulants: Secondary | ICD-10-CM

## 2017-10-04 DIAGNOSIS — Z5181 Encounter for therapeutic drug level monitoring: Secondary | ICD-10-CM | POA: Diagnosis not present

## 2017-10-04 LAB — POCT INR: INR: 1.5

## 2017-10-25 ENCOUNTER — Ambulatory Visit (INDEPENDENT_AMBULATORY_CARE_PROVIDER_SITE_OTHER): Payer: Medicare Other | Admitting: *Deleted

## 2017-10-25 DIAGNOSIS — I4891 Unspecified atrial fibrillation: Secondary | ICD-10-CM

## 2017-10-25 DIAGNOSIS — Z7901 Long term (current) use of anticoagulants: Secondary | ICD-10-CM

## 2017-10-25 DIAGNOSIS — Z5181 Encounter for therapeutic drug level monitoring: Secondary | ICD-10-CM

## 2017-10-25 LAB — POCT INR: INR: 2.1

## 2017-10-25 NOTE — Patient Instructions (Signed)
Continue coumadin 1/2 tablet daily Recheck in 4 wks

## 2017-11-08 DIAGNOSIS — I10 Essential (primary) hypertension: Secondary | ICD-10-CM | POA: Diagnosis not present

## 2017-11-08 DIAGNOSIS — I482 Chronic atrial fibrillation: Secondary | ICD-10-CM | POA: Diagnosis not present

## 2017-11-08 DIAGNOSIS — J449 Chronic obstructive pulmonary disease, unspecified: Secondary | ICD-10-CM | POA: Diagnosis not present

## 2017-11-08 DIAGNOSIS — J301 Allergic rhinitis due to pollen: Secondary | ICD-10-CM | POA: Diagnosis not present

## 2017-11-22 ENCOUNTER — Ambulatory Visit (INDEPENDENT_AMBULATORY_CARE_PROVIDER_SITE_OTHER): Payer: Medicare Other | Admitting: *Deleted

## 2017-11-22 DIAGNOSIS — I4891 Unspecified atrial fibrillation: Secondary | ICD-10-CM

## 2017-11-22 DIAGNOSIS — Z5181 Encounter for therapeutic drug level monitoring: Secondary | ICD-10-CM | POA: Diagnosis not present

## 2017-11-22 DIAGNOSIS — Z7901 Long term (current) use of anticoagulants: Secondary | ICD-10-CM | POA: Diagnosis not present

## 2017-11-22 LAB — POCT INR: INR: 2.2

## 2017-11-22 NOTE — Patient Instructions (Signed)
Continue coumadin 1/2 tablet daily Recheck in 4 wks

## 2017-12-08 ENCOUNTER — Ambulatory Visit (INDEPENDENT_AMBULATORY_CARE_PROVIDER_SITE_OTHER): Payer: Medicare Other | Admitting: Orthopedic Surgery

## 2017-12-08 ENCOUNTER — Encounter: Payer: Self-pay | Admitting: Orthopedic Surgery

## 2017-12-08 VITALS — BP 144/69 | HR 70 | Ht 63.0 in | Wt 139.0 lb

## 2017-12-08 DIAGNOSIS — M65332 Trigger finger, left middle finger: Secondary | ICD-10-CM

## 2017-12-08 NOTE — Progress Notes (Signed)
Patient ID: [name], female   DOB: 08-14-1934, 84 y.o.   MRN: [id number]  Chief Complaint  Patient presents with  . Hand Pain    trigger finger left middle    HPI [name] is a 84 y.o. female.  Presents for evaluation of catching locking left long finger  She complains of 3-53-month history of catching and locking and pain over the A1 pulley of the left long finger associated with loss of motion  Review of Systems Review of Systems  Constitutional: Negative.   Respiratory: Negative.   Cardiovascular: Negative.   Neurological: Negative.     Past Medical History:  Diagnosis Date  . Asthma   . Benign essential tremor   . COPD (chronic obstructive pulmonary disease) ([place])   . Essential hypertension, benign   . Paroxysmal atrial fibrillation [facility])     Past Surgical History:  Procedure Laterality Date  . ANTERIOR AND POSTERIOR REPAIR N/A 02/07/2013   Procedure: ANTERIOR (CYSTOCELE) AND POSTERIOR REPAIR (RECTOCELE);  Surgeon: Jonnie Kind, MD;  Location: [facility] OR[facility];  Service: Gynecology;  Laterality: N/A;  . BREAST BIOPSY Left 10 yrs ago   Dr Marolyn Hammock  . COLONOSCOPY N/A 12/29/2012   Procedure: COLONOSCOPY;  Surgeon: Rogene Houston, MD;  Location: [facility] SU[facility];  Service: Endoscopy;  Laterality: N/A;  12:15  . CYSTOSCOPY N/A 02/07/2013   Procedure: CYSTOSCOPY;  Surgeon: Jonnie Kind, MD;  Location: [facility];  Service: Gynecology;  Laterality: N/A;  . SALPINGOOPHORECTOMY Bilateral 02/07/2013   Procedure: SALPINGO OOPHORECTOMY;  Surgeon: Jonnie Kind, MD;  Location: [facility];  Service: Gynecology;  Laterality: Bilateral;  . VAGINAL HYSTERECTOMY N/A 02/07/2013   Procedure: HYSTERECTOMY VAGINAL;  Surgeon: Jonnie Kind, MD;  Location: [facility];  Service: Gynecology;  Laterality: N/A;    Family History  Problem Relation Age of Onset  . Breast cancer Mother   . Heart attack Father   . Heart disease Father   . Tremor Father   . Cancer Brother   . Tremor Brother   .  Diabetes Son   . Tremor Other     Social History Social History   Tobacco Use  . Smoking status: Former Smoker    Packs/day: 1.00    Years: 20.00    Pack years: 20.00    Types: Cigarettes    Start date: 03/08/1952    Last attempt to quit: 01/07/1991    Years since quitting: 26.9  . Smokeless tobacco: Never Used  Substance Use Topics  . Alcohol use: No    Alcohol/week: 0.0 oz  . Drug use: No    Allergies  Allergen Reactions  . Penicillins     "I don't remember the reaction", able to take Cephalosporins  . Codeine     Nausea    Current Outpatient Medications  Medication Sig Dispense Refill  . benazepril-hydrochlorthiazide (LOTENSIN HCT) 10-12.5 MG per tablet Take 0.5 tablets by mouth daily.     . budesonide-formoterol (SYMBICORT) 160-4.5 MCG/ACT inhaler Inhale 2 puffs into the lungs 2 (two) times daily.      Marland Kitchen diltiazem (CARDIZEM CD) 240 MG 24 hr capsule Take 240 mg by mouth daily.      . fish oil-omega-3 fatty acids 1000 MG capsule Take 1 g by mouth daily.      Marland Kitchen levocetirizine (XYZAL) 5 MG tablet Take 5 mg by mouth daily.      . montelukast (SINGULAIR) 10 MG tablet Take 10 mg by mouth at bedtime.      Marland Kitchen  Multiple Vitamins-Minerals (MULTIVITAMIN WITH MINERALS) tablet Take 1 tablet by mouth daily.      Marland Kitchen MYRBETRIQ 25 MG TB24 tablet Take 25 mg by mouth daily.     . propranolol (INDERAL) 20 MG tablet     . rosuvastatin (CRESTOR) 10 MG tablet Take 10 mg by mouth daily.      Marland Kitchen topiramate (TOPAMAX) 25 MG tablet Take 25 mg by mouth daily.     Marland Kitchen warfarin (COUMADIN) 5 MG tablet Take 2.5 mg by mouth daily.     Marland Kitchen diltiazem (CARDIZEM) 60 MG tablet Please only fill 30 day vs 90 day supply gets wasted as pt rarely takes this prn med For palpitations/pt. is also taking Diltiazem CD 240 mg daily (Patient not taking: Reported on 12/08/2017) 30 tablet 11   No current facility-administered medications for this visit.      Physical Exam Physical Exam Blood pressure (!) 144/69, pulse 70,  height 5\' 3"  (1.6 m), weight 139 lb (63 kg). Appearance, there are no abnormalities in terms of appearance the patient was well-developed and well-nourished. The grooming and hygiene were normal.  Mental status orientation, there was normal alertness and orientation Mood pleasant Ambulatory status normal with no assistive devices  Examination of the left hand reveals tenderness over the A1 pulley of the long finger Range of motion remains normal with clicking on flexion extension.  Stability tests show no abnormality of the IP joints are MP joint. The FDP and FDS strength is normal Skin warm dry and intact without laceration or ulceration or erythema Neurologic examination normal sensation Vascular examination normal pulses with warm extremity and normal capillary refill  The opposite extremity normal range of motion stability strength and alignment of the left long finger    Data Reviewed   Assessment  Encounter Diagnosis  Name Primary?  . Trigger middle finger of left hand Yes      Plan  Trigger finger injection  Diagnosis   Encounter Diagnosis  Name Primary?  . Trigger middle finger of left hand Yes    Procedure injection A1 pulley Medications lidocaine 1% 1 mL and Depo-Medrol 40 mg 1 mL Skin prep alcohol and ethyl chloride Verbal consent was obtained Timeout confirmed the injection site  After cleaning the skin with alcohol and anesthetizing the skin with ethyl chloride the A1 pulley was palpated and the injection was performed without complication

## 2017-12-08 NOTE — Patient Instructions (Addendum)
Trigger Finger Trigger finger (stenosing tenosynovitis) is a condition that causes a finger to get stuck in a bent position. Each finger has a tough, cord-like tissue that connects muscle to bone (tendon), and each tendon is surrounded by a tunnel of tissue (tendon sheath). To move your finger, your tendon needs to slide freely through the sheath. Trigger finger happens when the tendon or the sheath thickens, making it difficult to move your finger. Trigger finger can affect any finger or a thumb. It may affect more than one finger. Mild cases may clear up with rest and medicine. Severe cases require more treatment. What are the causes? Trigger finger is caused by a thickened finger tendon or tendon sheath. The cause of this thickening is not known. What increases the risk? The following factors may make you more likely to develop this condition:  Doing activities that require a strong grip.  Having rheumatoid arthritis, gout, or diabetes.  Being 40-60 years old.  Being a woman.  What are the signs or symptoms? Symptoms of this condition include:  Pain when bending or straightening your finger.  Tenderness or swelling where your finger attaches to the palm of your hand.  A lump in the palm of your hand or on the inside of your finger.  Hearing a popping sound when you try to straighten your finger.  Feeling a popping, catching, or locking sensation when you try to straighten your finger.  Being unable to straighten your finger.  How is this diagnosed? This condition is diagnosed based on your symptoms and a physical exam. How is this treated? This condition may be treated by:  Resting your finger and avoiding activities that make symptoms worse.  Wearing a finger splint to keep your finger in a slightly bent position.  Taking NSAIDs to relieve pain and swelling.  Injecting medicine (steroids) into the tendon sheath to reduce swelling and irritation. Injections may need to be  repeated.  Having surgery to open the tendon sheath. This may be done if other treatments do not work and you cannot straighten your finger. You may need physical therapy after surgery.  Follow these instructions at home:  Use moist heat to help reduce pain and swelling as told by your health care provider.  Rest your finger and avoid activities that make pain worse. Return to normal activities as told by your health care provider.  If you have a splint, wear it as told by your health care provider.  Take over-the-counter and prescription medicines only as told by your health care provider.  Keep all follow-up visits as told by your health care provider. This is important. Contact a health care provider if:  Your symptoms are not improving with home care. Summary  Trigger finger (stenosing tenosynovitis) causes your finger to get stuck in a bent position, and it can make it difficult and painful to straighten your finger.  This condition develops when a finger tendon or tendon sheath thickens.  Treatment starts with resting, wearing a splint, and taking NSAIDs.  In severe cases, surgery to open the tendon sheath may be needed. This information is not intended to replace advice given to you by your health care provider. Make sure you discuss any questions you have with your health care provider. Document Released: 07/25/2004 Document Revised: 09/15/2016 Document Reviewed: 09/15/2016 Elsevier Interactive Patient Education  2017 Elsevier Inc.  

## 2017-12-20 ENCOUNTER — Ambulatory Visit (INDEPENDENT_AMBULATORY_CARE_PROVIDER_SITE_OTHER): Payer: Medicare Other | Admitting: *Deleted

## 2017-12-20 DIAGNOSIS — I4891 Unspecified atrial fibrillation: Secondary | ICD-10-CM

## 2017-12-20 DIAGNOSIS — Z7901 Long term (current) use of anticoagulants: Secondary | ICD-10-CM | POA: Diagnosis not present

## 2017-12-20 DIAGNOSIS — Z5181 Encounter for therapeutic drug level monitoring: Secondary | ICD-10-CM | POA: Diagnosis not present

## 2017-12-20 LAB — POCT INR: INR: 2.4

## 2017-12-20 NOTE — Patient Instructions (Signed)
Continue coumadin 1/2 tablet daily Recheck in 6 wks

## 2017-12-31 DIAGNOSIS — J301 Allergic rhinitis due to pollen: Secondary | ICD-10-CM | POA: Diagnosis not present

## 2017-12-31 DIAGNOSIS — J3089 Other allergic rhinitis: Secondary | ICD-10-CM | POA: Diagnosis not present

## 2017-12-31 DIAGNOSIS — H1045 Other chronic allergic conjunctivitis: Secondary | ICD-10-CM | POA: Diagnosis not present

## 2017-12-31 DIAGNOSIS — J4530 Mild persistent asthma, uncomplicated: Secondary | ICD-10-CM | POA: Diagnosis not present

## 2018-01-17 DIAGNOSIS — J449 Chronic obstructive pulmonary disease, unspecified: Secondary | ICD-10-CM | POA: Diagnosis not present

## 2018-01-17 DIAGNOSIS — I10 Essential (primary) hypertension: Secondary | ICD-10-CM | POA: Diagnosis not present

## 2018-01-17 DIAGNOSIS — I482 Chronic atrial fibrillation: Secondary | ICD-10-CM | POA: Diagnosis not present

## 2018-01-17 DIAGNOSIS — R110 Nausea: Secondary | ICD-10-CM | POA: Diagnosis not present

## 2018-01-20 NOTE — Progress Notes (Signed)
Cardiology Office Note  Date: 01/21/2018   ID: [name], DOB November 12, 1933, MRN [id number]  PCP: Sinda Du, MD  Primary Cardiologist: Rozann Lesches, MD   Chief Complaint  Patient presents with  . PAF    History of Present Illness: [name] is an 84 y.o. female last seen in March 2018.  She is here today with her husband for a follow-up visit.  She reports no palpitations or chest pain, no increasing shortness of breath with activity.  She has had no falls or syncope.  She continues on Coumadin with follow-up in the anticoagulation clinic.  Most recent INR was 2.4.  No bleeding problems.  She continues to follow with Dr. Luan Pulling.  We are requesting her most recent lab work.  I personally reviewed her ECG today which shows normal sinus rhythm.  Past Medical History:  Diagnosis Date  . Asthma   . Benign essential tremor   . COPD (chronic obstructive pulmonary disease) ([place])   . Essential hypertension, benign   . Paroxysmal atrial fibrillation [facility])     Past Surgical History:  Procedure Laterality Date  . ANTERIOR AND POSTERIOR REPAIR N/A 02/07/2013   Procedure: ANTERIOR (CYSTOCELE) AND POSTERIOR REPAIR (RECTOCELE);  Surgeon: Jonnie Kind, MD;  Location: [facility];  Service: Gynecology;  Laterality: N/A;  . BREAST BIOPSY Left 10 yrs ago   Dr Marolyn Hammock  . COLONOSCOPY N/A 12/29/2012   Procedure: COLONOSCOPY;  Surgeon: Rogene Houston, MD;  Location[facility];  Service: Endoscopy;  Laterality: N/A;  12:15  . CYSTOSCOPY N/A 02/07/2013   Procedure: CYSTOSCOPY;  Surgeon: Jonnie Kind, MD;  Location: [facility];  Service: Gynecology;  Laterality: N/A;  . SALPINGOOPHORECTOMY Bilateral 02/07/2013   Procedure: SALPINGO OOPHORECTOMY;  Surgeon: Jonnie Kind, MD;  Location: [facility];  Service: Gynecology;  Laterality: Bilateral;  . VAGINAL HYSTERECTOMY N/A 02/07/2013   Procedure: HYSTERECTOMY VAGINAL;  Surgeon: Jonnie Kind, MD;  Location: [facility];  Service:  Gynecology;  Laterality: N/A;    Current Outpatient Medications  Medication Sig Dispense Refill  . benazepril-hydrochlorthiazide (LOTENSIN HCT) 10-12.5 MG per tablet Take 0.5 tablets by mouth daily.     . budesonide-formoterol (SYMBICORT) 160-4.5 MCG/ACT inhaler Inhale 2 puffs into the lungs 2 (two) times daily.      Marland Kitchen diltiazem (CARDIZEM CD) 240 MG 24 hr capsule Take 240 mg by mouth daily.      . fish oil-omega-3 fatty acids 1000 MG capsule Take 1 g by mouth daily.      Marland Kitchen levocetirizine (XYZAL) 5 MG tablet Take 5 mg by mouth daily.      . montelukast (SINGULAIR) 10 MG tablet Take 10 mg by mouth at bedtime.      . Multiple Vitamins-Minerals (MULTIVITAMIN WITH MINERALS) tablet Take 1 tablet by mouth daily.      Marland Kitchen MYRBETRIQ 25 MG TB24 tablet Take 25 mg by mouth daily.     . propranolol (INDERAL) 20 MG tablet     . rosuvastatin (CRESTOR) 10 MG tablet Take 10 mg by mouth daily.      Marland Kitchen topiramate (TOPAMAX) 25 MG tablet Take 25 mg by mouth daily.     Marland Kitchen warfarin (COUMADIN) 5 MG tablet Take 2.5 mg by mouth daily.      No current facility-administered medications for this visit.    Allergies:  Penicillins and Codeine   Social History: The patient  reports that she quit smoking about 27 years ago. Her smoking use included  cigarettes. She started smoking about 65 years ago. She has a 20.00 pack-year smoking history. She has never used smokeless tobacco. She reports that she does not drink alcohol or use drugs.   ROS:  Please see the history of present illness. Otherwise, complete review of systems is positive for none.  All other systems are reviewed and negative.   Physical Exam: VS:  BP 126/74   Pulse 70   Ht 5\' 3"  (1.6 m)   Wt 135 lb (61.2 kg)   SpO2 96%   BMI 23.91 kg/m , BMI Body mass index is 23.91 kg/m.  Wt Readings from Last 3 Encounters:  01/21/18 135 lb (61.2 kg)  12/08/17 139 lb (63 kg)  01/06/17 142 lb (64.4 kg)    General: Patient appears comfortable at rest. HEENT:  Conjunctiva and lids normal, oropharynx clear with moist mucosa. Neck: Supple, no elevated JVP or carotid bruits, no thyromegaly. Lungs: Clear to auscultation, nonlabored breathing at rest. Cardiac: Regular rate and rhythm, no S3 or significant systolic murmur, no pericardial rub. Abdomen: Soft, nontender, no hepatomegaly, bowel sounds present, no guarding or rebound. Extremities: No pitting edema, distal pulses 2+. Skin: Warm and dry. Musculoskeletal: No kyphosis. Neuropsychiatric: Alert and oriented x3, affect grossly appropriate.  ECG: I personally reviewed the tracing from 01/06/2017 which showed normal sinus rhythm.  Assessment and Plan:  1.  Paroxysmal atrial fibrillation.  CHADSVASC score is 4.  She continues on Coumadin for stroke prophylaxis and follows in the anticoagulation clinic.  No change in current dose of Cardizem CD.  She is in sinus rhythm today by ECG.  Requesting most recent lab work from Dr. Luan Pulling.  2.  Essential hypertension, on Lotensin HCT.  Blood pressure is adequately controlled today.  Current medicines were reviewed with the patient today.   Orders Placed This Encounter  Procedures  . EKG 12-Lead    Disposition: Follow-up in 1 year.  Signed, Satira Sark, MD, [facility] 01/21/2018 1:55 PM    [facility] at [facility] [address] [place], [place] [address] Phone: [phone]; Fax: [phone]

## 2018-01-21 ENCOUNTER — Ambulatory Visit (INDEPENDENT_AMBULATORY_CARE_PROVIDER_SITE_OTHER): Payer: Medicare Other | Admitting: Cardiology

## 2018-01-21 ENCOUNTER — Encounter: Payer: Self-pay | Admitting: Cardiology

## 2018-01-21 VITALS — BP 126/74 | HR 70 | Ht 63.0 in | Wt 135.0 lb

## 2018-01-21 DIAGNOSIS — I480 Paroxysmal atrial fibrillation: Secondary | ICD-10-CM | POA: Diagnosis not present

## 2018-01-21 DIAGNOSIS — I10 Essential (primary) hypertension: Secondary | ICD-10-CM | POA: Diagnosis not present

## 2018-01-21 NOTE — Patient Instructions (Signed)
Your physician wants you to follow-up in: 1 year with Dr.McDowell You will receive a reminder letter in the mail two months in advance. If you don't receive a letter, please call our office to schedule the follow-up appointment.    Your physician recommends that you continue on your current medications as directed. Please refer to the Current Medication list given to you today.    If you need a refill on your cardiac medications before your next appointment, please call your pharmacy.     No lab work or tests ordered today.      Thank you for choosing [facility] !        

## 2018-01-31 ENCOUNTER — Ambulatory Visit (INDEPENDENT_AMBULATORY_CARE_PROVIDER_SITE_OTHER): Payer: Medicare Other | Admitting: *Deleted

## 2018-01-31 DIAGNOSIS — Z7901 Long term (current) use of anticoagulants: Secondary | ICD-10-CM

## 2018-01-31 DIAGNOSIS — I4891 Unspecified atrial fibrillation: Secondary | ICD-10-CM

## 2018-01-31 DIAGNOSIS — Z5181 Encounter for therapeutic drug level monitoring: Secondary | ICD-10-CM | POA: Diagnosis not present

## 2018-01-31 LAB — POCT INR: INR: 3.3

## 2018-01-31 NOTE — Patient Instructions (Signed)
Hold coumadin tonight then resume 1/2 tablet daily Eat greens as before Recheck in 4 wks

## 2018-02-07 ENCOUNTER — Telehealth: Payer: Self-pay | Admitting: *Deleted

## 2018-02-07 NOTE — Telephone Encounter (Signed)
Spoke to pt.  She started prednisone and levaquin on 4/18 for bronchitis.  Took last dose of levaquin today.  Prednisone 4mg  tablets:  took 4 tabs x 3 days, 3 tabs x 3 days, 2 tabs x 3 days and 1 tab x 3 days.  She decreases to 2 tablets tomorrow.  Will finish 02/13/18.  Told pt to hold coumadin tonight and Wednesday night.  Other days continue 2.5mg .  Come for INR appt on 02/14/18.  Pt verbalized understanding.

## 2018-02-14 ENCOUNTER — Ambulatory Visit (INDEPENDENT_AMBULATORY_CARE_PROVIDER_SITE_OTHER): Payer: Medicare Other | Admitting: *Deleted

## 2018-02-14 DIAGNOSIS — I4891 Unspecified atrial fibrillation: Secondary | ICD-10-CM | POA: Diagnosis not present

## 2018-02-14 DIAGNOSIS — Z7901 Long term (current) use of anticoagulants: Secondary | ICD-10-CM | POA: Diagnosis not present

## 2018-02-14 DIAGNOSIS — Z5181 Encounter for therapeutic drug level monitoring: Secondary | ICD-10-CM | POA: Diagnosis not present

## 2018-02-14 LAB — POCT INR: INR: 3.7

## 2018-02-14 NOTE — Patient Instructions (Signed)
Decrease coumadin to 1/2 tablet daily except none on Mondays Eat greens as before Recheck in 2 wks

## 2018-02-28 ENCOUNTER — Ambulatory Visit (INDEPENDENT_AMBULATORY_CARE_PROVIDER_SITE_OTHER): Payer: Medicare Other | Admitting: *Deleted

## 2018-02-28 DIAGNOSIS — I4891 Unspecified atrial fibrillation: Secondary | ICD-10-CM | POA: Diagnosis not present

## 2018-02-28 DIAGNOSIS — Z5181 Encounter for therapeutic drug level monitoring: Secondary | ICD-10-CM | POA: Diagnosis not present

## 2018-02-28 DIAGNOSIS — Z7901 Long term (current) use of anticoagulants: Secondary | ICD-10-CM

## 2018-02-28 LAB — POCT INR: INR: 2.8

## 2018-02-28 NOTE — Patient Instructions (Signed)
Took coumadin this morning Continue coumadin 1/2 tablet daily except none on Mondays Eat greens as before Recheck in 4 wks

## 2018-03-28 ENCOUNTER — Ambulatory Visit (INDEPENDENT_AMBULATORY_CARE_PROVIDER_SITE_OTHER): Payer: Medicare Other | Admitting: *Deleted

## 2018-03-28 DIAGNOSIS — I4891 Unspecified atrial fibrillation: Secondary | ICD-10-CM

## 2018-03-28 DIAGNOSIS — Z7901 Long term (current) use of anticoagulants: Secondary | ICD-10-CM

## 2018-03-28 DIAGNOSIS — Z5181 Encounter for therapeutic drug level monitoring: Secondary | ICD-10-CM | POA: Diagnosis not present

## 2018-03-28 LAB — POCT INR: INR: 2.4 (ref 2.0–3.0)

## 2018-03-28 NOTE — Patient Instructions (Signed)
Took coumadin this morning Continue coumadin 1/2 tablet daily except none on Mondays Eat greens as before Recheck in 4 wks

## 2018-04-18 DIAGNOSIS — I10 Essential (primary) hypertension: Secondary | ICD-10-CM | POA: Diagnosis not present

## 2018-04-18 DIAGNOSIS — I482 Chronic atrial fibrillation: Secondary | ICD-10-CM | POA: Diagnosis not present

## 2018-04-18 DIAGNOSIS — E785 Hyperlipidemia, unspecified: Secondary | ICD-10-CM | POA: Diagnosis not present

## 2018-04-18 DIAGNOSIS — J449 Chronic obstructive pulmonary disease, unspecified: Secondary | ICD-10-CM | POA: Diagnosis not present

## 2018-04-25 ENCOUNTER — Ambulatory Visit (INDEPENDENT_AMBULATORY_CARE_PROVIDER_SITE_OTHER): Payer: Medicare Other | Admitting: *Deleted

## 2018-04-25 DIAGNOSIS — Z5181 Encounter for therapeutic drug level monitoring: Secondary | ICD-10-CM | POA: Diagnosis not present

## 2018-04-25 DIAGNOSIS — Z7901 Long term (current) use of anticoagulants: Secondary | ICD-10-CM

## 2018-04-25 DIAGNOSIS — I4891 Unspecified atrial fibrillation: Secondary | ICD-10-CM | POA: Diagnosis not present

## 2018-04-25 LAB — POCT INR: INR: 2.4 (ref 2.0–3.0)

## 2018-04-25 NOTE — Patient Instructions (Signed)
Took coumadin this morning Continue coumadin 1/2 tablet daily except none on Mondays Eat greens as before Recheck in 6 wks

## 2018-06-10 ENCOUNTER — Other Ambulatory Visit (HOSPITAL_COMMUNITY)
Admission: RE | Admit: 2018-06-10 | Discharge: 2018-06-10 | Disposition: A | Discharge status: Home / Self Care | Payer: Medicare Other | Source: Ambulatory Visit | Attending: Cardiology | Admitting: Cardiology

## 2018-06-10 ENCOUNTER — Ambulatory Visit (INDEPENDENT_AMBULATORY_CARE_PROVIDER_SITE_OTHER): Payer: Medicare Other | Admitting: *Deleted

## 2018-06-10 DIAGNOSIS — Z5181 Encounter for therapeutic drug level monitoring: Secondary | ICD-10-CM | POA: Insufficient documentation

## 2018-06-10 DIAGNOSIS — I4891 Unspecified atrial fibrillation: Secondary | ICD-10-CM | POA: Diagnosis not present

## 2018-06-10 DIAGNOSIS — Z7901 Long term (current) use of anticoagulants: Secondary | ICD-10-CM | POA: Diagnosis not present

## 2018-06-10 LAB — PROTIME-INR
INR: 4.92
Prothrombin Time: 45.5 seconds — ABNORMAL HIGH (ref 11.4–15.2)

## 2018-06-10 LAB — POCT INR: INR: 6.7 — AB (ref 2.0–3.0)

## 2018-06-13 ENCOUNTER — Telehealth: Payer: Self-pay | Admitting: *Deleted

## 2018-06-13 NOTE — Telephone Encounter (Signed)
Called and spoke to pt.  She thinks her blood is still messed up.  INR was elevated on 8/23.  She did hold coumadin x 2 days.  Offered pt INR appt on 8/27 in [place] but she wants to wait till 8/28 in [place].  Appt made.

## 2018-06-13 NOTE — Telephone Encounter (Signed)
Patient states that "her Coumadin is all out of whack" States that she is bruising and would like to speak with Lattie Haw. / tg

## 2018-06-15 ENCOUNTER — Ambulatory Visit (INDEPENDENT_AMBULATORY_CARE_PROVIDER_SITE_OTHER): Payer: Medicare Other | Admitting: *Deleted

## 2018-06-15 DIAGNOSIS — I4891 Unspecified atrial fibrillation: Secondary | ICD-10-CM

## 2018-06-15 DIAGNOSIS — Z5181 Encounter for therapeutic drug level monitoring: Secondary | ICD-10-CM | POA: Diagnosis not present

## 2018-06-15 DIAGNOSIS — Z7901 Long term (current) use of anticoagulants: Secondary | ICD-10-CM | POA: Diagnosis not present

## 2018-06-15 LAB — POCT INR: INR: 2.2 (ref 2.0–3.0)

## 2018-06-15 NOTE — Patient Instructions (Signed)
Continue coumadin 1/2 tablet daily except none on Mondays. Recheck in 2 weeks. Continue normal intake of leafy green vegetables.

## 2018-06-29 ENCOUNTER — Ambulatory Visit (INDEPENDENT_AMBULATORY_CARE_PROVIDER_SITE_OTHER): Payer: Medicare Other | Admitting: *Deleted

## 2018-06-29 DIAGNOSIS — Z7901 Long term (current) use of anticoagulants: Secondary | ICD-10-CM

## 2018-06-29 DIAGNOSIS — Z23 Encounter for immunization: Secondary | ICD-10-CM | POA: Diagnosis not present

## 2018-06-29 DIAGNOSIS — Z5181 Encounter for therapeutic drug level monitoring: Secondary | ICD-10-CM

## 2018-06-29 DIAGNOSIS — I4891 Unspecified atrial fibrillation: Secondary | ICD-10-CM

## 2018-06-29 DIAGNOSIS — Z0000 Encounter for general adult medical examination without abnormal findings: Secondary | ICD-10-CM | POA: Diagnosis not present

## 2018-06-29 LAB — POCT INR: INR: 4.5 — AB (ref 2.0–3.0)

## 2018-06-29 MED ORDER — WARFARIN SODIUM 2 MG PO TABS: 2.0000 mg | ORAL_TABLET | Freq: Every day | ORAL | 1 refills | Status: DC

## 2018-06-29 NOTE — Patient Instructions (Signed)
Hold coumadin Thursday and Friday then resume 1/2 tablet daily except none on Mondays. Recheck in 2 weeks. Continue normal intake of leafy green vegetables. SENT IN RX FOR WARFARIN 2MG  TO [facility] .  WILL CHANGE COUMADIN DOSAAGE AT NEXT INR CHECK.

## 2018-07-01 ENCOUNTER — Other Ambulatory Visit: Payer: Self-pay

## 2018-07-01 MED ORDER — WARFARIN SODIUM 2 MG PO TABS: ORAL_TABLET | ORAL | 1 refills | Status: DC

## 2018-07-15 ENCOUNTER — Ambulatory Visit (INDEPENDENT_AMBULATORY_CARE_PROVIDER_SITE_OTHER): Payer: Medicare Other | Admitting: *Deleted

## 2018-07-15 DIAGNOSIS — Z5181 Encounter for therapeutic drug level monitoring: Secondary | ICD-10-CM

## 2018-07-15 DIAGNOSIS — Z7901 Long term (current) use of anticoagulants: Secondary | ICD-10-CM

## 2018-07-15 DIAGNOSIS — I4891 Unspecified atrial fibrillation: Secondary | ICD-10-CM | POA: Diagnosis not present

## 2018-07-15 LAB — POCT INR: INR: 2.3 (ref 2.0–3.0)

## 2018-07-15 NOTE — Patient Instructions (Signed)
Continue coumadin 1/2 tablet daily except none on Mondays. Recheck in 2 weeks. Continue normal intake of leafy green vegetables.  HAS NOT RECEIVED 2mg  tablet YET.  WILL CALL WHEN SHE GETS I (SENT IN RX FOR WARFARIN 2MG  TO [facility] .  WILL CHANGE COUMADIN DOSAGE AT NEXT INR CHECK.)

## 2018-07-18 DIAGNOSIS — Z23 Encounter for immunization: Secondary | ICD-10-CM | POA: Diagnosis not present

## 2018-08-10 ENCOUNTER — Ambulatory Visit (INDEPENDENT_AMBULATORY_CARE_PROVIDER_SITE_OTHER): Payer: Medicare Other | Admitting: *Deleted

## 2018-08-10 DIAGNOSIS — I4891 Unspecified atrial fibrillation: Secondary | ICD-10-CM

## 2018-08-10 DIAGNOSIS — Z5181 Encounter for therapeutic drug level monitoring: Secondary | ICD-10-CM

## 2018-08-10 DIAGNOSIS — Z7901 Long term (current) use of anticoagulants: Secondary | ICD-10-CM | POA: Diagnosis not present

## 2018-08-10 LAB — POCT INR: INR: 1.4 — AB (ref 2.0–3.0)

## 2018-08-10 NOTE — Patient Instructions (Signed)
USING WARFARIN 2MG  TABLET NOW Take coumadin 2 tablets tonight and tomorrow night then resume 1 tablet daily (2mg ) Recheck in 10 days

## 2018-08-22 ENCOUNTER — Ambulatory Visit (INDEPENDENT_AMBULATORY_CARE_PROVIDER_SITE_OTHER): Payer: Medicare Other | Admitting: *Deleted

## 2018-08-22 DIAGNOSIS — Z5181 Encounter for therapeutic drug level monitoring: Secondary | ICD-10-CM

## 2018-08-22 DIAGNOSIS — Z7901 Long term (current) use of anticoagulants: Secondary | ICD-10-CM

## 2018-08-22 DIAGNOSIS — I4891 Unspecified atrial fibrillation: Secondary | ICD-10-CM

## 2018-08-22 LAB — POCT INR: INR: 2 (ref 2.0–3.0)

## 2018-08-22 NOTE — Patient Instructions (Signed)
USING WARFARIN 2MG  TABLET NOW Continue coumadin 1 tablet daily (2mg ) Recheck in 3 weeks

## 2018-09-07 DIAGNOSIS — I10 Essential (primary) hypertension: Secondary | ICD-10-CM | POA: Diagnosis not present

## 2018-09-07 DIAGNOSIS — I482 Chronic atrial fibrillation, unspecified: Secondary | ICD-10-CM | POA: Diagnosis not present

## 2018-09-07 DIAGNOSIS — J301 Allergic rhinitis due to pollen: Secondary | ICD-10-CM | POA: Diagnosis not present

## 2018-09-07 DIAGNOSIS — J449 Chronic obstructive pulmonary disease, unspecified: Secondary | ICD-10-CM | POA: Diagnosis not present

## 2018-09-08 ENCOUNTER — Telehealth: Payer: Self-pay | Admitting: Cardiology

## 2018-09-08 ENCOUNTER — Other Ambulatory Visit: Payer: Self-pay | Admitting: *Deleted

## 2018-09-08 MED ORDER — WARFARIN SODIUM 2 MG PO TABS: ORAL_TABLET | ORAL | 2 refills | Status: DC

## 2018-09-08 NOTE — Telephone Encounter (Signed)
Done.  New Rx for warfarin sent to mail order pharmacy with increased quanity so pt doesn't run out.

## 2018-09-08 NOTE — Telephone Encounter (Signed)
Patient needs help with medications.   Asked that you call her and help straighten out

## 2018-09-08 NOTE — Telephone Encounter (Signed)
Needs 90 day supply of warfarin sent to mail order.  Is running out to early.  New Rx sent.

## 2018-09-12 ENCOUNTER — Ambulatory Visit (INDEPENDENT_AMBULATORY_CARE_PROVIDER_SITE_OTHER): Payer: Medicare Other | Admitting: *Deleted

## 2018-09-12 DIAGNOSIS — Z7901 Long term (current) use of anticoagulants: Secondary | ICD-10-CM | POA: Diagnosis not present

## 2018-09-12 DIAGNOSIS — I4891 Unspecified atrial fibrillation: Secondary | ICD-10-CM | POA: Diagnosis not present

## 2018-09-12 DIAGNOSIS — Z5181 Encounter for therapeutic drug level monitoring: Secondary | ICD-10-CM

## 2018-09-12 LAB — POCT INR: INR: 2 (ref 2.0–3.0)

## 2018-09-12 NOTE — Patient Instructions (Signed)
USING WARFARIN 2MG  TABLET NOW Continue coumadin 1 tablet daily (2mg ) Recheck in 4 weeks

## 2018-09-27 DIAGNOSIS — N3000 Acute cystitis without hematuria: Secondary | ICD-10-CM | POA: Diagnosis not present

## 2018-09-28 ENCOUNTER — Ambulatory Visit (INDEPENDENT_AMBULATORY_CARE_PROVIDER_SITE_OTHER): Payer: Medicare Other | Admitting: *Deleted

## 2018-09-28 DIAGNOSIS — Z5181 Encounter for therapeutic drug level monitoring: Secondary | ICD-10-CM

## 2018-09-28 DIAGNOSIS — Z7901 Long term (current) use of anticoagulants: Secondary | ICD-10-CM

## 2018-09-28 DIAGNOSIS — I4891 Unspecified atrial fibrillation: Secondary | ICD-10-CM

## 2018-09-28 LAB — POCT INR: INR: 1.7 — AB (ref 2.0–3.0)

## 2018-09-28 NOTE — Patient Instructions (Addendum)
USING WARFARIN 2MG  TABLET NOW Increase coumadin to 1 tablet daily except 1 1/2 tablets on Sundays and Wednesdays Recheck in 4 weeks Charolette Forward (niece) Helps patient with meds. [phone]  Call her if pt needs help understanding. Pt lost her husband before Thanksgiving.  Will be going to [place] x 1 month for the holidays.  Daughter, June to call if pt has any medicine changes or needs a different antibiotic.

## 2018-10-26 ENCOUNTER — Ambulatory Visit (INDEPENDENT_AMBULATORY_CARE_PROVIDER_SITE_OTHER): Payer: Medicare Other | Admitting: Pharmacist

## 2018-10-26 DIAGNOSIS — I4891 Unspecified atrial fibrillation: Secondary | ICD-10-CM | POA: Diagnosis not present

## 2018-10-26 DIAGNOSIS — Z5181 Encounter for therapeutic drug level monitoring: Secondary | ICD-10-CM | POA: Diagnosis not present

## 2018-10-26 DIAGNOSIS — Z7901 Long term (current) use of anticoagulants: Secondary | ICD-10-CM | POA: Diagnosis not present

## 2018-10-26 LAB — POCT INR: INR: 3.9 — AB (ref 2.0–3.0)

## 2018-10-26 NOTE — Patient Instructions (Signed)
USING WARFARIN 2MG  TABLET NOW Skip dose today and tomorrow then resume 1 tablet daily except 1 1/2 tablets on Sundays and Wednesdays Recheck in 3 weeks

## 2018-11-14 DIAGNOSIS — F321 Major depressive disorder, single episode, moderate: Secondary | ICD-10-CM | POA: Diagnosis not present

## 2018-11-14 DIAGNOSIS — J449 Chronic obstructive pulmonary disease, unspecified: Secondary | ICD-10-CM | POA: Diagnosis not present

## 2018-11-14 DIAGNOSIS — I10 Essential (primary) hypertension: Secondary | ICD-10-CM | POA: Diagnosis not present

## 2018-11-14 DIAGNOSIS — I482 Chronic atrial fibrillation, unspecified: Secondary | ICD-10-CM | POA: Diagnosis not present

## 2018-11-16 ENCOUNTER — Ambulatory Visit (INDEPENDENT_AMBULATORY_CARE_PROVIDER_SITE_OTHER): Payer: Medicare Other | Admitting: Pharmacist

## 2018-11-16 DIAGNOSIS — I4891 Unspecified atrial fibrillation: Secondary | ICD-10-CM | POA: Diagnosis not present

## 2018-11-16 DIAGNOSIS — Z7901 Long term (current) use of anticoagulants: Secondary | ICD-10-CM | POA: Diagnosis not present

## 2018-11-16 DIAGNOSIS — Z5181 Encounter for therapeutic drug level monitoring: Secondary | ICD-10-CM | POA: Diagnosis not present

## 2018-11-16 LAB — POCT INR: INR: 2.7 (ref 2.0–3.0)

## 2018-11-16 NOTE — Patient Instructions (Signed)
USING WARFARIN 2MG  TABLET NOW Continue 1 tablet daily except 1 1/2 tablets on Sundays and Wednesdays Recheck in 4 weeks

## 2018-12-05 ENCOUNTER — Emergency Department (HOSPITAL_COMMUNITY)
Admission: EM | Admit: 2018-12-05 | Discharge: 2018-12-05 | Disposition: A | Discharge status: Home / Self Care | Payer: Medicare Other | Attending: Emergency Medicine | Admitting: Emergency Medicine

## 2018-12-05 ENCOUNTER — Emergency Department (HOSPITAL_COMMUNITY): Payer: Medicare Other

## 2018-12-05 ENCOUNTER — Encounter (HOSPITAL_COMMUNITY): Payer: Self-pay

## 2018-12-05 ENCOUNTER — Other Ambulatory Visit: Payer: Self-pay

## 2018-12-05 DIAGNOSIS — Z7901 Long term (current) use of anticoagulants: Secondary | ICD-10-CM | POA: Insufficient documentation

## 2018-12-05 DIAGNOSIS — I5042 Chronic combined systolic (congestive) and diastolic (congestive) heart failure: Secondary | ICD-10-CM | POA: Diagnosis not present

## 2018-12-05 DIAGNOSIS — Z79899 Other long term (current) drug therapy: Secondary | ICD-10-CM | POA: Diagnosis not present

## 2018-12-05 DIAGNOSIS — R410 Disorientation, unspecified: Secondary | ICD-10-CM | POA: Insufficient documentation

## 2018-12-05 DIAGNOSIS — J449 Chronic obstructive pulmonary disease, unspecified: Secondary | ICD-10-CM | POA: Insufficient documentation

## 2018-12-05 DIAGNOSIS — Z87891 Personal history of nicotine dependence: Secondary | ICD-10-CM | POA: Insufficient documentation

## 2018-12-05 DIAGNOSIS — N390 Urinary tract infection, site not specified: Secondary | ICD-10-CM | POA: Insufficient documentation

## 2018-12-05 DIAGNOSIS — I10 Essential (primary) hypertension: Secondary | ICD-10-CM

## 2018-12-05 DIAGNOSIS — R42 Dizziness and giddiness: Secondary | ICD-10-CM

## 2018-12-05 DIAGNOSIS — R05 Cough: Secondary | ICD-10-CM | POA: Diagnosis not present

## 2018-12-05 DIAGNOSIS — R0602 Shortness of breath: Secondary | ICD-10-CM | POA: Diagnosis not present

## 2018-12-05 DIAGNOSIS — I110 Hypertensive heart disease with heart failure: Secondary | ICD-10-CM | POA: Insufficient documentation

## 2018-12-05 DIAGNOSIS — R001 Bradycardia, unspecified: Secondary | ICD-10-CM | POA: Diagnosis not present

## 2018-12-05 DIAGNOSIS — R4182 Altered mental status, unspecified: Secondary | ICD-10-CM | POA: Diagnosis not present

## 2018-12-05 LAB — URINALYSIS, ROUTINE W REFLEX MICROSCOPIC
Bilirubin Urine: NEGATIVE
Glucose, UA: NEGATIVE mg/dL
Ketones, ur: NEGATIVE mg/dL
Nitrite: NEGATIVE
Protein, ur: NEGATIVE mg/dL
SPECIFIC GRAVITY, URINE: 1.014 (ref 1.005–1.030)
WBC, UA: >50 WBC/hpf — ABNORMAL HIGH (ref 0–5)
pH: 6 (ref 5.0–8.0)

## 2018-12-05 LAB — BASIC METABOLIC PANEL
Anion gap: 8 (ref 5–15)
BUN: 13 mg/dL (ref 8–23)
CO2: 22 mmol/L (ref 22–32)
Calcium: 9.2 mg/dL (ref 8.9–10.3)
Chloride: 109 mmol/L (ref 98–111)
Creatinine, Ser: 0.55 mg/dL (ref 0.44–1.00)
GFR calc Af Amer: >60 mL/min (ref >60)
Glucose, Bld: 98 mg/dL (ref 70–99)
Potassium: 3.8 mmol/L (ref 3.5–5.1)
Sodium: 139 mmol/L (ref 135–145)

## 2018-12-05 LAB — CBC WITH DIFFERENTIAL/PLATELET
Abs Immature Granulocytes: 0.02 10*3/uL (ref 0.00–0.07)
Basophils Absolute: 0.1 10*3/uL (ref 0.0–0.1)
Basophils Relative: 1 %
Eosinophils Absolute: 0.3 10*3/uL (ref 0.0–0.5)
Eosinophils Relative: 4 %
HCT: 41.9 % (ref 36.0–46.0)
Hemoglobin: 13.2 g/dL (ref 12.0–15.0)
Immature Granulocytes: 0 %
Lymphocytes Relative: 24 %
Lymphs Abs: 1.7 10*3/uL (ref 0.7–4.0)
MCH: 29.8 pg (ref 26.0–34.0)
MCHC: 31.5 g/dL (ref 30.0–36.0)
MCV: 94.6 fL (ref 80.0–100.0)
MONO ABS: 0.7 10*3/uL (ref 0.1–1.0)
Monocytes Relative: 10 %
Neutro Abs: 4.1 10*3/uL (ref 1.7–7.7)
Neutrophils Relative %: 61 %
PLATELETS: 313 10*3/uL (ref 150–400)
RBC: 4.43 MIL/uL (ref 3.87–5.11)
RDW: 13.3 % (ref 11.5–15.5)
WBC: 6.8 10*3/uL (ref 4.0–10.5)
nRBC: 0 % (ref 0.0–0.2)

## 2018-12-05 LAB — TROPONIN I: Troponin I: <0.03 ng/mL (ref <0.03)

## 2018-12-05 LAB — PROTIME-INR
INR: 2.79
Prothrombin Time: 29 seconds — ABNORMAL HIGH (ref 11.4–15.2)

## 2018-12-05 MED ORDER — CEPHALEXIN 250 MG PO CAPS: 250.0000 mg | ORAL_CAPSULE | Freq: Four times a day (QID) | ORAL | 0 refills | Status: DC

## 2018-12-05 MED ORDER — CEPHALEXIN 500 MG PO CAPS
ORAL_CAPSULE | ORAL | Status: AC
  Administered 2018-12-05: 500 mg
  Filled 2018-12-05: qty 1

## 2018-12-05 MED ORDER — CEPHALEXIN 250 MG PO CAPS
250.0000 mg | ORAL_CAPSULE | Freq: Once | ORAL | Status: DC
  Filled 2018-12-05: qty 1

## 2018-12-05 NOTE — ED Triage Notes (Signed)
Pt reports difficulty breathing and elevated BP since yesterday. Pt reports coughing for 1 day. Non productive.BP 200/100 per neighbor. Denies pain

## 2018-12-05 NOTE — ED Notes (Signed)
Patient transported to CT 

## 2018-12-05 NOTE — ED Provider Notes (Signed)
[facility] EMERGENCY DEPARTMENT Provider Note   CSN: [id number] Arrival date & time: 12/05/18  1233    History   Chief Complaint Chief Complaint  Patient presents with  . Shortness of Breath  . Hypertension    HPI [name] is a 85 y.o. female.  She is presenting to the emergency department complaining of elevated blood pressure since this morning.  She said when she woke up she did not feel well.  She was dizzy/lightheaded and felt a little nauseous.  She thought she might be getting sick.  She said the symptoms lasted for couple hours.  1 of her relatives is here with her and states she was having problems remembering if she took her medicine or was also having difficulty explaining how she felt.  She does say that she seems improved now.  They are again to make an appointment with a primary care doctor but decided to come here because the blood pressure was so elevated.  She said it was 200/100.  She is had a little bit of a cough.  She has some vague chest discomfort.  The symptoms seem improved now.  She does live alone and her husband recently passed away in 2023/09/16 and she has been feeling very sad about that and is under a lot of stress doing paperwork.     The history is provided by the patient and a relative.  Hypertension  This is a new problem. Episode onset: 6 hours. The problem has been gradually improving. Associated symptoms include chest pain and shortness of breath. Pertinent negatives include no abdominal pain and no headaches. Nothing aggravates the symptoms. Nothing relieves the symptoms. She has tried nothing for the symptoms. The treatment provided no relief.    Past Medical History:  Diagnosis Date  . Asthma   . Benign essential tremor   . COPD (chronic obstructive pulmonary disease) ([place])   . Essential hypertension, benign   . Paroxysmal atrial fibrillation [facility])     Patient Active Problem List   Diagnosis Date Noted  . Encounter for therapeutic  drug monitoring 11/13/2013  . Essential and other specified forms of tremor 10/25/2013  . Prolapse of uterus 01/20/2013  . Paroxysmal atrial fibrillation ([place]) 11/26/2011  . Chronic anticoagulation 11/26/2011  . HYPERLIPIDEMIA, CONTROLLED 07/09/2009  . HYPERTENSION 07/09/2009  . Chronic combined systolic and diastolic congestive heart failure ([place]) 07/09/2009  . COPD 07/09/2009    Past Surgical History:  Procedure Laterality Date  . ANTERIOR AND POSTERIOR REPAIR N/A 02/07/2013   Procedure: ANTERIOR (CYSTOCELE) AND POSTERIOR REPAIR (RECTOCELE);  Surgeon: Jonnie Kind, MD;  Location: [facility] ORS;  Service: Gynecology;  Laterality: N/A;  . BREAST BIOPSY Left 10 yrs ago   Dr Marolyn Hammock  . COLONOSCOPY N/A 12/29/2012   Procedure: COLONOSCOPY;  Surgeon: Rogene Houston, MD;  Location: [facility] ENDO SUITE;  Service: Endoscopy;  Laterality: N/A;  12:15  . CYSTOSCOPY N/A 02/07/2013   Procedure: CYSTOSCOPY;  Surgeon: Jonnie Kind, MD;  Location: [facility] ORS;  Service: Gynecology;  Laterality: N/A;  . SALPINGOOPHORECTOMY Bilateral 02/07/2013   Procedure: SALPINGO OOPHORECTOMY;  Surgeon: Jonnie Kind, MD;  Location: [facility] ORS;  Service: Gynecology;  Laterality: Bilateral;  . VAGINAL HYSTERECTOMY N/A 02/07/2013   Procedure: HYSTERECTOMY VAGINAL;  Surgeon: Jonnie Kind, MD;  Location: [facility] ORS;  Service: Gynecology;  Laterality: N/A;     OB History    Gravida  2   Para  2   Term  Preterm      AB      Living        SAB      TAB      Ectopic      Multiple      Live Births               Home Medications    Prior to Admission medications   Medication Sig Start Date End Date Taking? Authorizing Provider  budesonide-formoterol (SYMBICORT) 160-4.5 MCG/ACT inhaler Inhale 2 puffs into the lungs 2 (two) times daily.     Yes [provider]  diltiazem (CARDIZEM CD) 240 MG 24 hr capsule Take 240 mg by mouth daily.     Yes [provider]  escitalopram (LEXAPRO) 5 MG tablet  Take 5 mg by mouth daily.  11/14/18  Yes [provider]  fish oil-omega-3 fatty acids 1000 MG capsule Take 1 g by mouth daily.     Yes [provider]  levocetirizine (XYZAL) 5 MG tablet Take 5 mg by mouth daily.     Yes [provider]  montelukast (SINGULAIR) 10 MG tablet Take 10 mg by mouth at bedtime.     Yes [provider]  Multiple Vitamins-Minerals (MULTIVITAMIN WITH MINERALS) tablet Take 1 tablet by mouth daily.     Yes [provider]  MYRBETRIQ 25 MG TB24 tablet Take 25 mg by mouth daily.  12/03/16  Yes [provider]  propranolol (INDERAL) 20 MG tablet Take 20 mg by mouth 2 (two) times daily.  09/19/17  Yes [provider]  rosuvastatin (CRESTOR) 10 MG tablet Take 10 mg by mouth daily.     Yes [provider]  topiramate (TOPAMAX) 25 MG tablet Take 25 mg by mouth daily.    Yes [provider]  warfarin (COUMADIN) 2 MG tablet Take 1 tablet daily or as directed. Patient taking differently: Take 2-3 mg by mouth See admin instructions. Take 2 mg every mon, tues, thurs, fri, sat, and 3 mg every Sunday and Wednesday. 09/08/18  Yes Satira Sark, MD    Family History Family History  Problem Relation Age of Onset  . Breast cancer Mother   . Heart attack Father   . Heart disease Father   . Tremor Father   . Cancer Brother   . Tremor Brother   . Diabetes Son   . Tremor Other     Social History Social History   Tobacco Use  . Smoking status: Former Smoker    Packs/day: 1.00    Years: 20.00    Pack years: 20.00    Types: Cigarettes    Start date: 03/08/1952    Last attempt to quit: 01/07/1991    Years since quitting: 27.9  . Smokeless tobacco: Never Used  Substance Use Topics  . Alcohol use: No    Alcohol/week: 0.0 standard drinks  . Drug use: No     Allergies   Penicillins and Codeine   Review of Systems Review of Systems  Constitutional: Negative for fever.  HENT: Negative for  sore throat.   Eyes: Negative for visual disturbance.  Respiratory: Positive for cough and shortness of breath.   Cardiovascular: Positive for chest pain.  Gastrointestinal: Positive for nausea. Negative for abdominal pain.  Genitourinary: Negative for dysuria.  Musculoskeletal: Negative for back pain.  Skin: Negative for rash.  Neurological: Positive for tremors and light-headedness. Negative for seizures, syncope, facial asymmetry, speech difficulty, weakness, numbness and headaches.  Psychiatric/Behavioral: Positive  for confusion.     Physical Exam Updated Vital Signs BP (!) 162/75 (BP Location: Left Arm)   Pulse 63   Temp (!) 97.4 F (36.3 C) (Oral)   Resp 18   Ht 5\' 3"  (1.6 m)   SpO2 96%   BMI 23.91 kg/m   Physical Exam Vitals signs and nursing note reviewed.  Constitutional:      General: She is not in acute distress.    Appearance: She is well-developed.  HENT:     Head: Normocephalic and atraumatic.  Eyes:     Conjunctiva/sclera: Conjunctivae normal.  Neck:     Musculoskeletal: Neck supple.  Cardiovascular:     Rate and Rhythm: Normal rate and regular rhythm.     Heart sounds: No murmur.  Pulmonary:     Effort: Pulmonary effort is normal. No respiratory distress.     Breath sounds: Normal breath sounds.  Abdominal:     Palpations: Abdomen is soft.     Tenderness: There is no abdominal tenderness.  Musculoskeletal: Normal range of motion.     Right lower leg: She exhibits no tenderness. No edema.     Left lower leg: She exhibits no tenderness. No edema.  Skin:    General: Skin is warm and dry.     Capillary Refill: Capillary refill takes less than 2 seconds.  Neurological:     General: No focal deficit present.     Mental Status: She is alert and oriented to person, place, and time.     GCS: GCS eye subscore is 4. GCS verbal subscore is 5. GCS motor subscore is 6.     Cranial Nerves: Cranial nerves are intact. No cranial nerve deficit.     Sensory:  Sensation is intact.     Motor: Motor function is intact. No weakness.     Coordination: Coordination is intact.     Gait: Gait is intact.     Comments: She has a little tremor, baseline for patient.      ED Treatments / Results  Labs (all labs ordered are listed, but only abnormal results are displayed) Labs Reviewed  URINALYSIS, ROUTINE W REFLEX MICROSCOPIC - Abnormal; Notable for the following components:      Result Value   Color, Urine AMBER (*)    APPearance CLOUDY (*)    Hgb urine dipstick SMALL (*)    Leukocytes,Ua LARGE (*)    WBC, UA >50 (*)    Bacteria, UA FEW (*)    All other components within normal limits  PROTIME-INR - Abnormal; Notable for the following components:   Prothrombin Time 29.0 (*)    All other components within normal limits  URINE CULTURE  BASIC METABOLIC PANEL  CBC WITH DIFFERENTIAL/PLATELET  TROPONIN I    EKG EKG Interpretation  Date/Time:  Monday December 05 2018 12:46:10 EST Ventricular Rate:  56 PR Interval:  186 QRS Duration: 78 QT Interval:  412 QTC Calculation: 397 R Axis:   66 Text Interpretation:  Sinus bradycardia Otherwise normal ECG Rate slower than prior 7/16 Confirmed by Aletta Edouard [phone]) on 12/05/2018 3:05:17 PM   Radiology Dg Chest 2 View  Result Date: 12/05/2018 CLINICAL DATA:  Nonproductive cough and shortness of breath since yesterday. EXAM: CHEST - 2 VIEW COMPARISON:  Chest x-ray dated November 26, 2015. FINDINGS: The heart size and mediastinal contours are within normal limits. Atherosclerotic calcification of the aortic arch. Normal pulmonary vascularity. The lungs remain hyperinflated with chronic biapical pleuroparenchymal scarring. No focal consolidation, pleural  effusion, or pneumothorax. New mild age-indeterminate compression deformity at the thoracolumbar junction. IMPRESSION: 1.  No active cardiopulmonary disease.  COPD. 2. New mild age-indeterminate compression deformity at the thoracolumbar junction.  Correlate with point tenderness. 3.  Aortic atherosclerosis (ICD10-I70.0). Electronically Signed   By: Titus Dubin M.D.   On: 12/05/2018 13:13    Procedures Procedures (including critical care time)  Medications Ordered in ED Medications  cephALEXin (KEFLEX) 500 MG capsule (500 mg  Given 12/05/18 1847)     Initial Impression / Assessment and Plan / ED Course  I have reviewed the triage vital signs and the nursing notes.  Pertinent labs & imaging results that were available during my care of the patient were reviewed by me and considered in my medical decision making (see chart for details).      85 year old female with period of confusion earlier today and some generalized fatigue.  Currently all her symptoms are resolved.  Her work-up is been fairly unremarkable including a CAT scan of her head chest x-ray and lab work.  Her urinalysis just came back with greater than 50 whites and this may be an explanation for her symptoms.  Is started on some antibiotics and she will follow-up with her primary care doctor.  She understands to return if any worsening symptoms.  Final Clinical Impressions(s) / ED Diagnoses   Final diagnoses:  Confusion  Lightheadedness  Hypertension, unspecified type  Lower urinary tract infection    ED Discharge Orders    None       Hayden Rasmussen, MD 12/05/18 [phone]

## 2018-12-05 NOTE — ED Notes (Signed)
Pt with sob since yesterday, cough at times with occ. productive

## 2018-12-05 NOTE — Discharge Instructions (Addendum)
You were seen in the emergency department for evaluation of an episode of confusion this morning along with elevated blood pressure and feeling lightheaded.  You had a CAT scan of your head along with blood work EKG and a urinalysis.  The only significant finding was that you have a urinary tract infection.  We are prescribing you antibiotics to take.  Please finish them.  Drink plenty of fluids.  Follow-up with your doctor and return if any worsening symptoms.

## 2018-12-07 DIAGNOSIS — I10 Essential (primary) hypertension: Secondary | ICD-10-CM | POA: Diagnosis not present

## 2018-12-07 DIAGNOSIS — Z634 Disappearance and death of family member: Secondary | ICD-10-CM | POA: Diagnosis not present

## 2018-12-07 DIAGNOSIS — I482 Chronic atrial fibrillation, unspecified: Secondary | ICD-10-CM | POA: Diagnosis not present

## 2018-12-07 DIAGNOSIS — J449 Chronic obstructive pulmonary disease, unspecified: Secondary | ICD-10-CM | POA: Diagnosis not present

## 2018-12-08 ENCOUNTER — Encounter

## 2018-12-08 ENCOUNTER — Ambulatory Visit (INDEPENDENT_AMBULATORY_CARE_PROVIDER_SITE_OTHER): Payer: Medicare Other | Admitting: Neurology

## 2018-12-08 ENCOUNTER — Encounter: Payer: Self-pay | Admitting: Neurology

## 2018-12-08 VITALS — BP 151/73 | HR 65 | Ht 63.0 in | Wt 135.0 lb

## 2018-12-08 DIAGNOSIS — R413 Other amnesia: Secondary | ICD-10-CM

## 2018-12-08 DIAGNOSIS — E538 Deficiency of other specified B group vitamins: Secondary | ICD-10-CM

## 2018-12-08 HISTORY — DX: Other amnesia: R41.3

## 2018-12-08 LAB — URINE CULTURE: Culture: >=100000 — AB

## 2018-12-08 MED ORDER — DONEPEZIL HCL 5 MG PO TABS: 5.0000 mg | ORAL_TABLET | Freq: Every day | ORAL | 1 refills | Status: DC

## 2018-12-08 NOTE — Patient Instructions (Signed)
   We will start Aricept at night for the memory.   Begin Aricept (donepezil) at 5 mg at night for one month. If this medication is well-tolerated, please call our office and we will call in a prescription for the 10 mg tablets. Look out for side effects that may include nausea, diarrhea, weight loss, or stomach cramps. This medication will also cause a runny nose, therefore there is no need for allergy medications for this purpose.

## 2018-12-08 NOTE — Progress Notes (Signed)
Reason for visit: Memory disturbance, essential tremor  Referring physician: Dr. [place] [name] is a 85 y.o. female  History of present illness:  [name] is an 85 year old right-handed white female who was seen through this office in 2015 with an essential tremor.  The patient has a tremor affecting the hands, head and neck, and some slight vocal tremor.  She has a prominent family history of her tremor, she has been on Topamax for a number of years and she takes propranolol.  The patient has atrial fibrillation, she is also on Coumadin for this reason.  She comes into the office today with a new problem, she comes in with her daughter.  Over the last 2 years, the patient has had some gradual decline in memory, her husband passed away in September 19, 2018, the patient is now living alone.  Following the death of her husband, her daughter has had to come in and take over controlling the medications and to help keep up with appointments, and to do the finances.  It became apparent that the patient had a significant memory issue around that time.  The patient still operates a motor vehicle, she is safe doing this and she has not had any significant problems with directions or getting lost.  The patient was in the emergency room on 05 December 2018 with some dizziness and increased confusion, she was found to have a urinary tract infection which has been treated.  The patient does have some anxiety that is usually manifest early in the morning, she is on Lexapro for this.  She in general has a reasonable energy level during the day, she sometimes will take midday naps.  She does have some bladder control issues, she denies any significant balance problems.  A CT scan of the brain done in February 2020 revealed extensive white matter changes, some cortical atrophy was seen.  The patient does have significant high blood pressure problems, she came into the emergency room with a blood pressure of  200/100.  The patient reports no numbness or weakness of the extremities.  She denies a family history of memory problems.  She is sent to this office for an evaluation.  Past Medical History:  Diagnosis Date  . A-fib ([place])   . Asthma   . Benign essential tremor   . COPD (chronic obstructive pulmonary disease) ([place])   . Essential hypertension, benign   . Hypertension   . Paroxysmal atrial fibrillation ([facility])   . Tremor     Past Surgical History:  Procedure Laterality Date  . ANTERIOR AND POSTERIOR REPAIR N/A 02/07/2013   Procedure: ANTERIOR (CYSTOCELE) AND POSTERIOR REPAIR (RECTOCELE);  Surgeon: Jonnie Kind, MD;  Location: [facility] ORS;  Service: Gynecology;  Laterality: N/A;  . Bladder Tack    . BREAST BIOPSY Left 10 yrs ago   Dr Marolyn Hammock  . COLONOSCOPY N/A 12/29/2012   Procedure: COLONOSCOPY;  Surgeon: Rogene Houston, MD;  Location: [facility] ENDO SUITE;  Service: Endoscopy;  Laterality: N/A;  12:15  . CYSTOSCOPY N/A 02/07/2013   Procedure: CYSTOSCOPY;  Surgeon: Jonnie Kind, MD;  Location: [facility] ORS;  Service: Gynecology;  Laterality: N/A;  . SALPINGOOPHORECTOMY Bilateral 02/07/2013   Procedure: SALPINGO OOPHORECTOMY;  Surgeon: Jonnie Kind, MD;  Location: [facility] ORS;  Service: Gynecology;  Laterality: Bilateral;  . VAGINAL HYSTERECTOMY N/A 02/07/2013   Procedure: HYSTERECTOMY VAGINAL;  Surgeon: Jonnie Kind, MD;  Location: [facility] OR[facility];  Service: Gynecology;  Laterality: N/A;  Family History  Problem Relation Age of Onset  . Breast cancer Mother   . Heart attack Father   . Heart disease Father   . Tremor Father   . Cancer Brother   . Tremor Brother   . Diabetes Son   . Tremor Other     Social history:  reports that she quit smoking about 27 years ago. Her smoking use included cigarettes. She started smoking about 66 years ago. She has a 20.00 pack-year smoking history. She has never used smokeless tobacco. She reports that she does not drink alcohol or use drugs.  Medications:    Prior to Admission medications   Medication Sig Start Date End Date Taking? Authorizing Provider  budesonide-formoterol (SYMBICORT) 160-4.5 MCG/ACT inhaler Inhale 2 puffs into the lungs 2 (two) times daily.     Yes [provider]  cephALEXin (KEFLEX) 250 MG capsule Take 1 capsule (250 mg total) by mouth 4 (four) times daily. 12/05/18  Yes Hayden Rasmussen, MD  diltiazem (CARDIZEM CD) 240 MG 24 hr capsule Take 240 mg by mouth daily.     Yes [provider]  escitalopram (LEXAPRO) 5 MG tablet Take 10 mg by mouth daily.  11/14/18  Yes [provider]  fish oil-omega-3 fatty acids 1000 MG capsule Take 1 g by mouth daily.     Yes [provider]  levocetirizine (XYZAL) 5 MG tablet Take 5 mg by mouth daily.     Yes [provider]  montelukast (SINGULAIR) 10 MG tablet Take 10 mg by mouth at bedtime.     Yes [provider]  Multiple Vitamins-Minerals (MULTIVITAMIN WITH MINERALS) tablet Take 1 tablet by mouth daily.     Yes [provider]  MYRBETRIQ 25 MG TB24 tablet Take 25 mg by mouth daily.  12/03/16  Yes [provider]  propranolol (INDERAL) 20 MG tablet Take 20 mg by mouth 2 (two) times daily.  09/19/17  Yes [provider]  rosuvastatin (CRESTOR) 10 MG tablet Take 10 mg by mouth daily.     Yes [provider]  topiramate (TOPAMAX) 25 MG tablet Take 25 mg by mouth daily.    Yes [provider]  warfarin (COUMADIN) 2 MG tablet Take 1 tablet daily or as directed. Patient taking differently: Take 2-3 mg by mouth See admin instructions. Take 2 mg every mon, tues, thurs, fri, sat, and 3 mg every Sunday and Wednesday. 09/08/18  Yes Satira Sark, MD      Allergies  Allergen Reactions  . Ibuprofen Swelling  . Penicillins     "I don't remember the reaction", able to take Cephalosporins  . Codeine     Nausea    ROS:  Out of a complete 14 system review of symptoms, the patient complains only of  the following symptoms, and all other reviewed systems are negative.  Weight loss Easy bruising Memory loss, confusion, tremor Depression, anxiety, hallucinations Incontinence of the bladder Insomnia  Blood pressure (!) 151/73, pulse 65, height 5\' 3"  (1.6 m), weight 135 lb (61.2 kg).  Physical Exam  General: The patient is alert and cooperative at the time of the examination.  Eyes: Pupils are equal, round, and reactive to light. Discs are flat bilaterally.  Neck: The neck is supple, no carotid bruits are noted.  Respiratory: The respiratory examination is clear.  Cardiovascular: The cardiovascular examination reveals a regular rate and rhythm, no obvious murmurs or rubs are noted.  Skin: Extremities are without significant edema.  Neurologic Exam  Mental status: The patient is alert and oriented x 3 at the time of the examination. The Mini-Mental status examination done today shows a total score of 17/30.  Cranial nerves: Facial symmetry is present. There is good sensation of the face to pinprick and soft touch bilaterally. The strength of the facial muscles and the muscles to head turning and shoulder shrug are normal bilaterally. Speech is well enunciated, no aphasia or dysarthria is noted. Extraocular movements are full. Visual fields are full. The tongue is midline, and the patient has symmetric elevation of the soft palate. No obvious hearing deficits are noted.  Intermittent side-to-side head tremors are noted.  Motor: The motor testing reveals 5 over 5 strength of all 4 extremities. Good symmetric motor tone is noted throughout.  Sensory: Sensory testing is intact to pinprick, soft touch, vibration sensation, and position sense on all 4 extremities, with exception of some decrease in pinprick sensation on the right hand and decreased position sense on the left foot. No evidence of extinction is noted.  Coordination: Cerebellar testing reveals good finger-nose-finger and  heel-to-shin bilaterally.  The patient does have an intention tremor with finger-nose-finger bilaterally.  Gait and station: Gait is slightly wide-based, the patient can walk independently. Tandem gait is unsteady. Romberg is negative. No drift is seen.  Reflexes: Deep tendon reflexes are symmetric and normal bilaterally. Toes are downgoing bilaterally.   CT head 12/05/18:  IMPRESSION: 1. No intracranial hemorrhage or CT evidence of large acute infarct. 2. Prominent chronic microvascular changes. 3. Global atrophy.  * CT scan images were reviewed online. I agree with the written report.    Assessment/Plan:  1.  Benign essential tremor  2.  Memory disturbance  3.  Extensive white matter disease by CT brain  The patient likely has some memory issues in part related to the small vessel disease seen by CT of the brain.  The patient is on anticoagulation currently.  I have recommended home monitoring of blood pressures, if they remain greater than 696 systolic, she is to contact her primary care physician.  The patient will be sent for blood work today.  She will be placed on low-dose Aricept, she will follow-up in 6 months.  She will call for any dose adjustments of the medication.  Donna Alexanders MD 12/08/2018 8:32 AM  [facility] [address] [place], [place] [address]  Phone [phone] Fax [phone]

## 2018-12-09 ENCOUNTER — Telehealth: Payer: Self-pay | Admitting: Emergency Medicine

## 2018-12-09 LAB — RPR: RPR Ser Ql: NONREACTIVE

## 2018-12-09 LAB — VITAMIN B12: Vitamin B-12: 554 pg/mL (ref 232–1245)

## 2018-12-09 LAB — SEDIMENTATION RATE: Sed Rate: 3 mm/hr (ref 0–40)

## 2018-12-09 NOTE — Telephone Encounter (Signed)
Post ED Visit - Positive Culture Follow-up  Culture report reviewed by antimicrobial stewardship pharmacist: [place] Team []  Elenor Quinones, Pharm.D. []  Heide Guile, Pharm.D., BCPS AQ-ID []  Parks Neptune, Pharm.D., BCPS []  Alycia Rossetti, Pharm.D., BCPS []  [place], Pharm.D., BCPS, AAHIVP []  Legrand Como, Pharm.D., BCPS, AAHIVP []  Salome Arnt, PharmD, BCPS []  Johnnette Gourd, PharmD, BCPS [x]  Hughes Better, PharmD, BCPS []  Leeroy Cha, PharmD []  Laqueta Linden, PharmD, BCPS []  Albertina Parr, PharmD  [place] Team []  Leodis Sias, PharmD []  Lindell Spar, PharmD []  Royetta Asal, PharmD []  Graylin Shiver, Rph []  Rema Fendt) Glennon Mac, PharmD []  Arlyn Dunning, PharmD []  Netta Cedars, PharmD []  Dia Sitter, PharmD []  Leone Haven, PharmD []  Gretta Arab, PharmD []  Theodis Shove, PharmD []  Peggyann Juba, PharmD []  Reuel Boom, PharmD   Positive urine culture Treated with Cephalexin, organism sensitive to the same and no further patient follow-up is required at this time.  [place] Annalei Friesz 12/09/2018, 8:48 AM

## 2018-12-12 ENCOUNTER — Ambulatory Visit (INDEPENDENT_AMBULATORY_CARE_PROVIDER_SITE_OTHER): Payer: Medicare Other | Admitting: *Deleted

## 2018-12-12 DIAGNOSIS — Z7901 Long term (current) use of anticoagulants: Secondary | ICD-10-CM | POA: Diagnosis not present

## 2018-12-12 DIAGNOSIS — I4891 Unspecified atrial fibrillation: Secondary | ICD-10-CM

## 2018-12-12 DIAGNOSIS — Z5181 Encounter for therapeutic drug level monitoring: Secondary | ICD-10-CM

## 2018-12-12 LAB — POCT INR: INR: 2.5 (ref 2.0–3.0)

## 2018-12-12 NOTE — Patient Instructions (Signed)
USING WARFARIN 2MG  TABLET NOW Continue 1 tablet daily except 1 1/2 tablets on Sundays and Wednesdays Recheck in 4 weeks Charolette Forward (niece) Helps patient with meds. [phone]  Call her if pt needs help understanding

## 2019-01-02 ENCOUNTER — Telehealth: Payer: Self-pay

## 2019-01-02 MED ORDER — MEMANTINE HCL 5 MG PO TABS: ORAL_TABLET | ORAL | 0 refills | Status: AC

## 2019-01-02 NOTE — Telephone Encounter (Signed)
Received a call from the patient's daughter [name] stating that when her mother started taking donepezil (ARICEPT) 5 MG tablet that she started having GI problems. Patient is not currently taking the Aricept and the GI problems have sinced stopped. She would like to know if there is anything else that her mother can take. Please advise.

## 2019-01-02 NOTE — Telephone Encounter (Signed)
I called the daughter.  The patient could not tolerate Aricept secondary to diarrhea.  I will try Namenda to see if she can get on this medication.

## 2019-01-05 ENCOUNTER — Telehealth: Payer: Self-pay | Admitting: *Deleted

## 2019-01-05 NOTE — Telephone Encounter (Signed)
1. Have you recently travelled abroad or to [place], [place], or [place]? NO 2. Do you currently have a fever? NO 3. Have you been in contact with someone that is currently pending confirmation of COVID19 testing or has been confirmed to have the COVID19 virus? NO 4. Are you currently experiencing fatigue or cough? NO 5. Are you currently experiencing new or worsening shortness of breath at rest or with minimal activity? NO 6. Have you been in contact with someone that was recently sick with fever/cough/fatigue? NO   **A score of 4 or more should result in cancellation of the pts cardiology appt  **A score of 2 should be provided a mask prior to admission into the lobby  **TRAVEL to a high risk area or contact with a confirmed case should stay at home, away from confirmed patient, monitor symptoms, and reach out to PCP for evisit, additional testing.   **ALL PTS WITH FEVER SHOULD BE REFERRED TO PCP FOR EVISIT  Pt. Advised that we are restricting visitors at this time and request that only patients present for check-in prior to their appointment. All other visitors should remain in their car. If necessary, only one visitor may come with the patient into the building. For everyone's safety, all patients and visitors entering our practice area should expect to be screened again prior to entering our waiting area.   PT CONFIRMED APPT AND VERBALIZED UNDERSTANDING OF ABOVE.

## 2019-01-12 ENCOUNTER — Other Ambulatory Visit: Payer: Self-pay

## 2019-01-12 ENCOUNTER — Emergency Department (HOSPITAL_COMMUNITY)
Admission: EM | Admit: 2019-01-12 | Discharge: 2019-01-13 | Disposition: A | Discharge status: Home / Self Care | Payer: Medicare Other | Attending: Emergency Medicine | Admitting: Emergency Medicine

## 2019-01-12 ENCOUNTER — Encounter (HOSPITAL_COMMUNITY): Payer: Self-pay

## 2019-01-12 DIAGNOSIS — J449 Chronic obstructive pulmonary disease, unspecified: Secondary | ICD-10-CM | POA: Diagnosis not present

## 2019-01-12 DIAGNOSIS — Z23 Encounter for immunization: Secondary | ICD-10-CM | POA: Diagnosis not present

## 2019-01-12 DIAGNOSIS — I10 Essential (primary) hypertension: Secondary | ICD-10-CM | POA: Insufficient documentation

## 2019-01-12 DIAGNOSIS — Z79899 Other long term (current) drug therapy: Secondary | ICD-10-CM | POA: Diagnosis not present

## 2019-01-12 DIAGNOSIS — J45909 Unspecified asthma, uncomplicated: Secondary | ICD-10-CM | POA: Insufficient documentation

## 2019-01-12 DIAGNOSIS — Y939 Activity, unspecified: Secondary | ICD-10-CM | POA: Diagnosis not present

## 2019-01-12 DIAGNOSIS — S51011A Laceration without foreign body of right elbow, initial encounter: Secondary | ICD-10-CM | POA: Diagnosis not present

## 2019-01-12 DIAGNOSIS — Z87891 Personal history of nicotine dependence: Secondary | ICD-10-CM | POA: Insufficient documentation

## 2019-01-12 DIAGNOSIS — T148XXA Other injury of unspecified body region, initial encounter: Secondary | ICD-10-CM

## 2019-01-12 DIAGNOSIS — S81812A Laceration without foreign body, left lower leg, initial encounter: Secondary | ICD-10-CM | POA: Diagnosis not present

## 2019-01-12 DIAGNOSIS — Y92009 Unspecified place in unspecified non-institutional (private) residence as the place of occurrence of the external cause: Secondary | ICD-10-CM | POA: Diagnosis not present

## 2019-01-12 DIAGNOSIS — Y999 Unspecified external cause status: Secondary | ICD-10-CM | POA: Diagnosis not present

## 2019-01-12 DIAGNOSIS — W19XXXA Unspecified fall, initial encounter: Secondary | ICD-10-CM | POA: Diagnosis not present

## 2019-01-12 DIAGNOSIS — Z7901 Long term (current) use of anticoagulants: Secondary | ICD-10-CM | POA: Diagnosis not present

## 2019-01-12 DIAGNOSIS — S51012A Laceration without foreign body of left elbow, initial encounter: Secondary | ICD-10-CM | POA: Diagnosis not present

## 2019-01-12 DIAGNOSIS — S59901A Unspecified injury of right elbow, initial encounter: Secondary | ICD-10-CM | POA: Diagnosis present

## 2019-01-12 MED ORDER — LIDOCAINE HCL URETHRAL/MUCOSAL 2 % EX GEL
1.0000 "application " | Freq: Once | CUTANEOUS | Status: AC
  Administered 2019-01-13: 1 via TOPICAL

## 2019-01-12 MED ORDER — LIDOCAINE VISCOUS HCL 2 % MT SOLN
OROMUCOSAL | Status: AC
  Filled 2019-01-12: qty 15

## 2019-01-12 MED ORDER — TETANUS-DIPHTH-ACELL PERTUSSIS 5-2.5-18.5 LF-MCG/0.5 IM SUSP
0.5000 mL | Freq: Once | INTRAMUSCULAR | Status: AC
  Administered 2019-01-12: 0.5 mL via INTRAMUSCULAR
  Filled 2019-01-12: qty 0.5

## 2019-01-12 MED ORDER — LIDOCAINE HCL URETHRAL/MUCOSAL 2 % EX GEL
CUTANEOUS | Status: AC
  Administered 2019-01-13: 1 via TOPICAL
  Filled 2019-01-12: qty 10

## 2019-01-12 NOTE — ED Provider Notes (Signed)
   85 year old female with skin tear to the back of her left lower leg and right elbow.  Bleeding controlled.  I was asked by Dr. Rogene Houston to evaluate and close skin tears.  injuries involve the skin only.   This was my only involvement in the patient's care.    SKIN AVULSION  #1 Performed by: Zamiyah Resendes Authorized by: Ahliyah Nienow Consent: Verbal consent obtained. Risks and benefits: risks, benefits and alternatives were discussed Consent given by: patient Patient identity confirmed: provided demographic data Prepped and Draped in normal sterile fashion Wound explored  Laceration Location: left lower posterior leg  Laceration Length:  10 x 8 cm  No Foreign Bodies seen or palpated  Anesthesia: topical application  Local anesthetic: Lidocaine 2% jelly  Anesthetic total: 3 mL  Irrigation method: syringe Amount of cleaning: standard  Skin closure: steri-stips  Technique: topical application  Patient tolerance: Patient tolerated the procedure well with no immediate complications.    SKIN AVULSION #2 Performed by: Hilda Rynders Authorized by: Aleksey Newbern Consent: Verbal consent obtained. Risks and benefits: risks, benefits and alternatives were discussed Consent given by: patient Patient identity confirmed: provided demographic data Prepped and Draped in normal sterile fashion Wound explored  Laceration Location: right elbow  Laceration Length: 3 cm  No Foreign Bodies seen or palpated  Anesthesia:topical application  Local anesthetic: lidocaine 2% jelly  Anesthetic total: 1 mL  Irrigation method: syringe Amount of cleaning: standard  Skin closure: steri-strips  Technique: topical application  Patient tolerance: Patient tolerated the procedure well with no immediate complications.   Wounds were bandaged with non-stick dressing, Td updated.      Kem Parkinson, PA-C 01/13/19 0005    Fredia Sorrow, MD 01/13/19 [phone]

## 2019-01-12 NOTE — ED Provider Notes (Signed)
[facility] EMERGENCY DEPARTMENT Provider Note   CSN: [id number] Arrival date & time: 01/12/19  1752    History   Chief Complaint Chief Complaint  Patient presents with  . Fall    HPI [name] is a 85 y.o. female.     Patient brought in by family member her daughter.  Patient had a fall at home apparently on concrete with a skin tear to right elbow and left leg posterior calf area.  Patient denies hitting her head or any injury or neck pain or low back pain.  No hip pain or pelvic pain.  Patient has a history of memory difficulty.  She was on patient is on Coumadin.  Bleeding controlled from the skin tears.  Patient not sure if his tetanus is up-to-date.  Patient denies any loss of consciousness or syncope.  Patient denies any fevers or upper respiratory symptoms or any known exposure to coronavirus.     Past Medical History:  Diagnosis Date  . A-fib ([place])   . Asthma   . Benign essential tremor   . COPD (chronic obstructive pulmonary disease) ([place])   . Essential hypertension, benign   . Hypertension   . Memory difficulty 12/08/2018  . Paroxysmal atrial fibrillation ([facility])   . Tremor     Patient Active Problem List   Diagnosis Date Noted  . Memory difficulty 12/08/2018  . Encounter for therapeutic drug monitoring 11/13/2013  . Essential and other specified forms of tremor 10/25/2013  . Prolapse of uterus 01/20/2013  . Paroxysmal atrial fibrillation ([place]) 11/26/2011  . Chronic anticoagulation 11/26/2011  . HYPERLIPIDEMIA, CONTROLLED 07/09/2009  . HYPERTENSION 07/09/2009  . Chronic combined systolic and diastolic congestive heart failure ([place]) 07/09/2009  . COPD 07/09/2009    Past Surgical History:  Procedure Laterality Date  . ANTERIOR AND POSTERIOR REPAIR N/A 02/07/2013   Procedure: ANTERIOR (CYSTOCELE) AND POSTERIOR REPAIR (RECTOCELE);  Surgeon: Jonnie Kind, MD;  Location: [facility] ORS;  Service: Gynecology;  Laterality: N/A;  . Bladder Tack    . BREAST  BIOPSY Left 10 yrs ago   Dr Marolyn Hammock  . COLONOSCOPY N/A 12/29/2012   Procedure: COLONOSCOPY;  Surgeon: Rogene Houston, MD;  Location: [facility] ENDO SUITE;  Service: Endoscopy;  Laterality: N/A;  12:15  . CYSTOSCOPY N/A 02/07/2013   Procedure: CYSTOSCOPY;  Surgeon: Jonnie Kind, MD;  Location: [facility];  Service: Gynecology;  Laterality: N/A;  . SALPINGOOPHORECTOMY Bilateral 02/07/2013   Procedure: SALPINGO OOPHORECTOMY;  Surgeon: Jonnie Kind, MD;  Location: [facility];  Service: Gynecology;  Laterality: Bilateral;  . VAGINAL HYSTERECTOMY N/A 02/07/2013   Procedure: HYSTERECTOMY VAGINAL;  Surgeon: Jonnie Kind, MD;  Location: [facility];  Service: Gynecology;  Laterality: N/A;     OB History    Gravida  2   Para  2   Term      Preterm      AB      Living        SAB      TAB      Ectopic      Multiple      Live Births               Home Medications    Prior to Admission medications   Medication Sig Start Date End Date Taking? Authorizing Provider  budesonide-formoterol (SYMBICORT) 160-4.5 MCG/ACT inhaler Inhale 2 puffs into the lungs 2 (two) times daily.     Yes [provider]  diltiazem (CARDIZEM CD) 240  MG 24 hr capsule Take 240 mg by mouth every morning.    Yes [provider]  escitalopram (LEXAPRO) 10 MG tablet Take 10 mg by mouth every evening.  01/09/19  Yes [provider]  levocetirizine (XYZAL) 5 MG tablet Take 5 mg by mouth every evening.    Yes [provider]  montelukast (SINGULAIR) 10 MG tablet Take 10 mg by mouth at bedtime.     Yes [provider]  Multiple Vitamins-Minerals (MULTIVITAMIN WITH MINERALS) tablet Take 1 tablet by mouth daily.     Yes [provider]  MYRBETRIQ 25 MG TB24 tablet Take 25 mg by mouth every morning.  12/03/16  Yes [provider]  PROAIR HFA 108 (90 Base) MCG/ACT inhaler Inhale 1-2 puffs into the lungs every 6 (six) hours as needed for wheezing or shortness of breath.   12/07/18  Yes [provider]  propranolol (INDERAL) 20 MG tablet Take 20 mg by mouth 2 (two) times daily.  09/19/17  Yes [provider]  rosuvastatin (CRESTOR) 10 MG tablet Take 10 mg by mouth every evening.    Yes [provider]  topiramate (TOPAMAX) 25 MG tablet Take 25 mg by mouth every evening.    Yes [provider]  warfarin (COUMADIN) 2 MG tablet Take 1 tablet daily or as directed. Patient taking differently: Take 2-3 mg by mouth See admin instructions. Take 2 mg every mon, tues, thurs, fri, sat, and 3 mg every Sunday and Wednesday. 09/08/18  Yes Satira Sark, MD  cephALEXin (KEFLEX) 250 MG capsule Take 1 capsule (250 mg total) by mouth 4 (four) times daily. Patient not taking: Reported on 01/12/2019 12/05/18   Hayden Rasmussen, MD  memantine [facility]) 5 MG tablet Take 1 tablet daily for one week, then take 1 tablet twice daily for one week, then take 1 tablet in the morning and 2 in the evening for one week, then take 2 tablets twice daily Patient not taking: Reported on 01/12/2019 01/02/19   Kathrynn Ducking, MD    Family History Family History  Problem Relation Age of Onset  . Breast cancer Mother   . Heart attack Father   . Heart disease Father   . Tremor Father   . Cancer Brother   . Tremor Brother   . Diabetes Son   . Tremor Other     Social History Social History   Tobacco Use  . Smoking status: Former Smoker    Packs/day: 1.00    Years: 20.00    Pack years: 20.00    Types: Cigarettes    Start date: 03/08/1952    Last attempt to quit: 01/07/1991    Years since quitting: 28.0  . Smokeless tobacco: Never Used  Substance Use Topics  . Alcohol use: No    Alcohol/week: 0.0 standard drinks  . Drug use: No     Allergies   Aricept [donepezil hcl]; Ibuprofen; Penicillins; and Codeine   Review of Systems Review of Systems  Constitutional: Negative for chills and fever.  HENT: Negative for congestion, rhinorrhea and sore  throat.   Eyes: Negative for visual disturbance.  Respiratory: Negative for cough and shortness of breath.   Cardiovascular: Negative for chest pain and leg swelling.  Gastrointestinal: Negative for abdominal pain, diarrhea, nausea and vomiting.  Genitourinary: Negative for dysuria.  Musculoskeletal: Negative for back pain and neck pain.  Skin: Positive for wound. Negative for rash.  Neurological: Negative for dizziness, light-headedness and headaches.  Hematological: Does  not bruise/bleed easily.  Psychiatric/Behavioral: Negative for confusion.     Physical Exam Updated Vital Signs BP (!) 168/96   Pulse 67   Temp 97.6 F (36.4 C) (Oral)   Resp 16   Ht 1.6 m (5\' 3" )   Wt 64.9 kg   SpO2 96%   BMI 25.33 kg/m   Physical Exam Vitals signs and nursing note reviewed.  Constitutional:      General: She is not in acute distress.    Appearance: She is well-developed.  HENT:     Head: Normocephalic and atraumatic.     Comments: No evidence of any scalp or head trauma.  Neck is nontender to palpation posteriorly.    Mouth/Throat:     Mouth: Mucous membranes are moist.  Eyes:     Extraocular Movements: Extraocular movements intact.     Conjunctiva/sclera: Conjunctivae normal.     Pupils: Pupils are equal, round, and reactive to light.  Neck:     Musculoskeletal: Normal range of motion and neck supple. No muscular tenderness.  Cardiovascular:     Rate and Rhythm: Normal rate and regular rhythm.     Heart sounds: Normal heart sounds. No murmur.  Pulmonary:     Effort: Pulmonary effort is normal. No respiratory distress.     Breath sounds: Normal breath sounds.  Abdominal:     General: Bowel sounds are normal.     Palpations: Abdomen is soft.     Tenderness: There is no abdominal tenderness.  Musculoskeletal:     Comments: Right elbow with about a 3cm skin tear.  No bony tenderness good range of motion at the elbow.  Distally radial pulses 1-2+.  Behind the left calf is a  large skin tear.  Probably approximately 10 cm x 6 cm.  With adipose tissue underneath.  No deep structure involvement.  Skin flap still there.  No evidence of any bony injury to the left leg area.  Skin:    General: Skin is warm and dry.     Capillary Refill: Capillary refill takes less than 2 seconds.  Neurological:     General: No focal deficit present.     Mental Status: She is alert. Mental status is at baseline.     Comments: Seems to be some memory deficit.      ED Treatments / Results  Labs (all labs ordered are listed, but only abnormal results are displayed) Labs Reviewed - No data to display  EKG None  Radiology No results found.  Procedures Procedures (including critical care time)  Medications Ordered in ED Medications  lidocaine (XYLOCAINE) 2 % jelly 1 application (has no administration in time range)  lidocaine (XYLOCAINE) 2 % jelly (has no administration in time range)  Tdap (BOOSTRIX) injection 0.5 mL (0.5 mLs Intramuscular Given 01/12/19 2131)     Initial Impression / Assessment and Plan / ED Course  I have reviewed the triage vital signs and the nursing notes.  Pertinent labs & imaging results that were available during my care of the patient were reviewed by me and considered in my medical decision making (see chart for details).        Skin tears and wound care taken care of by physician assistant.  See separate note.  Patient without any evidence of any other injuries.  Patient does have some mild memory problems.  Patient without any evidence of any head injury.  Patient is on Coumadin no significant problems with bleeding noted here.  Patient without any  hip pain no pelvic pain no low back pain no pain to the posterior part of the neck or tenderness to any of those areas.    Final Clinical Impressions(s) / ED Diagnoses   Final diagnoses:  Fall, initial encounter  Multiple skin tears    ED Discharge Orders    None      Medical screening  examination/treatment/procedure(s) were conducted as a shared visit with non-physician practitioner(s) and myself.  I personally evaluated the patient during the encounter.  None    Fredia Sorrow, MD 01/13/19 [phone]

## 2019-01-12 NOTE — ED Triage Notes (Addendum)
Pt fell on concrete and hit her right elbow and left leg. Laceration noted to right elbow, deep skin avulsion tear noted to back of left calf, bleeding controlled at this time.

## 2019-01-12 NOTE — ED Notes (Signed)
[phone] [name] Daughter

## 2019-01-12 NOTE — Discharge Instructions (Addendum)
Skin tears repaired.  Keep dressings in place for 2 days.  Then dressing can be removed reapply back to trace and ointment and then redress.  Do not remove Steri-Strips let them come off on their own.  Return for any new or worse symptoms.

## 2019-01-13 DIAGNOSIS — S51011A Laceration without foreign body of right elbow, initial encounter: Secondary | ICD-10-CM | POA: Diagnosis not present

## 2019-01-13 NOTE — ED Notes (Signed)
Pt wandered out to desk to be discharged. V/s not taken. Pt discharged and ambulated to daughter's car

## 2019-01-16 ENCOUNTER — Ambulatory Visit (INDEPENDENT_AMBULATORY_CARE_PROVIDER_SITE_OTHER): Payer: Medicare Other | Admitting: *Deleted

## 2019-01-16 ENCOUNTER — Other Ambulatory Visit: Payer: Self-pay

## 2019-01-16 DIAGNOSIS — Z5181 Encounter for therapeutic drug level monitoring: Secondary | ICD-10-CM

## 2019-01-16 DIAGNOSIS — I4891 Unspecified atrial fibrillation: Secondary | ICD-10-CM

## 2019-01-16 DIAGNOSIS — Z7901 Long term (current) use of anticoagulants: Secondary | ICD-10-CM | POA: Diagnosis not present

## 2019-01-16 LAB — POCT INR: INR: 2.6 (ref 2.0–3.0)

## 2019-01-16 NOTE — Patient Instructions (Signed)
USING WARFARIN 2MG  TABLET NOW Continue 1 tablet daily except 1 1/2 tablets on Sundays and Wednesdays Recheck in 6 weeks Charolette Forward (niece) Helps patient with meds. [phone]  Call her if pt needs help understanding.

## 2019-01-24 DIAGNOSIS — I482 Chronic atrial fibrillation, unspecified: Secondary | ICD-10-CM | POA: Diagnosis not present

## 2019-01-24 DIAGNOSIS — J449 Chronic obstructive pulmonary disease, unspecified: Secondary | ICD-10-CM | POA: Diagnosis not present

## 2019-01-24 DIAGNOSIS — S81812A Laceration without foreign body, left lower leg, initial encounter: Secondary | ICD-10-CM | POA: Diagnosis not present

## 2019-01-24 DIAGNOSIS — F0390 Unspecified dementia without behavioral disturbance: Secondary | ICD-10-CM | POA: Diagnosis not present

## 2019-02-13 DIAGNOSIS — T50905A Adverse effect of unspecified drugs, medicaments and biological substances, initial encounter: Secondary | ICD-10-CM | POA: Diagnosis not present

## 2019-02-13 DIAGNOSIS — F0390 Unspecified dementia without behavioral disturbance: Secondary | ICD-10-CM | POA: Diagnosis not present

## 2019-02-13 DIAGNOSIS — J449 Chronic obstructive pulmonary disease, unspecified: Secondary | ICD-10-CM | POA: Diagnosis not present

## 2019-02-16 ENCOUNTER — Telehealth: Payer: Self-pay | Admitting: Cardiology

## 2019-02-16 NOTE — Telephone Encounter (Signed)
Virtual Visit Pre-Appointment Phone Call  "(Name), I am calling you today to discuss your upcoming appointment. We are currently trying to limit exposure to the virus that causes COVID-19 by seeing patients at home rather than in the office."  1. "What is the BEST phone number to call the day of the visit?" - include this in appointment notes  2. Do you have or have access to (through a family member/friend) a smartphone with video capability that we can use for your visit?" a. If yes - list this number in appt notes as cell (if different from BEST phone #) and list the appointment type as a VIDEO visit in appointment notes b. If no - list the appointment type as a PHONE visit in appointment notes  3. Confirm consent - "In the setting of the current Covid19 crisis, you are scheduled for a (phone or video) visit with your provider on (date) at (time).  Just as we do with many in-office visits, in order for you to participate in this visit, we must obtain consent.  If you'd like, I can send this to your mychart (if signed up) or email for you to review.  Otherwise, I can obtain your verbal consent now.  All virtual visits are billed to your insurance company just like a normal visit would be.  By agreeing to a virtual visit, we'd like you to understand that the technology does not allow for your provider to perform an examination, and thus may limit your provider's ability to fully assess your condition. If your provider identifies any concerns that need to be evaluated in person, we will make arrangements to do so.  Finally, though the technology is pretty good, we cannot assure that it will always work on either your or our end, and in the setting of a video visit, we may have to convert it to a phone-only visit.  In either situation, we cannot ensure that we have a secure connection.  Are you willing to proceed?" STAFF: Did the patient verbally acknowledge consent to telehealth visit? Document  YES/NO here: Yes  4. Advise patient to be prepared - "Two hours prior to your appointment, go ahead and check your blood pressure, pulse, oxygen saturation, and your weight (if you have the equipment to check those) and write them all down. When your visit starts, your provider will ask you for this information. If you have an Apple Watch or Kardia device, please plan to have heart rate information ready on the day of your appointment. Please have a pen and paper handy nearby the day of the visit as well."  5. Give patient instructions for MyChart download to smartphone OR [email or web address] as below if video visit (depending on what platform provider is using)  6. Inform patient they will receive a phone call 15 minutes prior to their appointment time (may be from unknown caller ID) so they should be prepared to answer    TELEPHONE CALL NOTE  REYNE FALCONI has been deemed a candidate for a follow-up tele-health visit to limit community exposure during the Covid-19 pandemic. I spoke with the patient via phone to ensure availability of phone/video source, confirm preferred email & phone number, and discuss instructions and expectations.  I reminded Tenny Craw to be prepared with any vital sign and/or heart rhythm information that could potentially be obtained via home monitoring, at the time of her visit. I reminded Tenny Craw to expect a phone call prior to  her visit.  Bertram Gala Goins 02/16/2019 4:09 PM

## 2019-02-23 ENCOUNTER — Telehealth: Payer: Self-pay | Admitting: Pharmacist

## 2019-02-23 MED ORDER — APIXABAN 5 MG PO TABS: 5.0000 mg | ORAL_TABLET | Freq: Two times a day (BID) | ORAL | 0 refills | Status: DC

## 2019-02-23 NOTE — Telephone Encounter (Signed)
Patient qualifies for transitioning from warfarin to a DOAC and was called on 02/23/2019 to assess for willingness to switch. I spoke with daughter, June, who manages patients medications. Daughter is interested in switching to Eliquis ($33/month) which is affordable to patient.   Patient will come in 5/11 for INR check. Will switch to Eliquis at that time.   Gwenlyn Found, [place] D PGY1 Pharmacy Resident  02/23/2019   1:28 PM

## 2019-02-23 NOTE — Telephone Encounter (Signed)
Noted.  Will see pt on 02/27/19

## 2019-02-24 ENCOUNTER — Encounter: Payer: Self-pay | Admitting: Cardiology

## 2019-02-24 NOTE — Progress Notes (Signed)
Virtual Visit via Video Note   This visit type was conducted due to national recommendations for restrictions regarding the COVID-19 Pandemic (e.g. social distancing) in an effort to limit this patient's exposure and mitigate transmission in our community.  Due to her co-morbid illnesses, this patient is at least at moderate risk for complications without adequate follow up.  This format is felt to be most appropriate for this patient at this time.  All issues noted in this document were discussed and addressed.  A limited physical exam was performed with this format.  Please refer to the patient's chart for her consent to telehealth for [facility].   Date:  02/27/2019   ID:  [name], DOB [birth year], MRN [id number]  Patient Location: Home Provider Location: Home  PCP:  Sinda Du, MD  Cardiologist:  Rozann Lesches, MD  Evaluation Performed:  Follow-Up Visit  Chief Complaint:   Cardiac follow-up  History of Present Illness:    [name] is an 85 y.o. female last seen in April 2019.  We communicated via video conferencing today, her daughter was also present.  She states that she has been doing about the same, no palpitations or chest pain, no dizziness or syncope.  She was previously on Coumadin with follow-up in anticoagulation clinic, however is changing to Eliquis.  In fact, her INR is 3.0 today, Coumadin held, and she will be starting Eliquis Wednesday.  She will have follow-up lab work in June.  Interval ECG from February of this year is outlined below.  Daughter states that blood pressure has been increased intermittently, although not in a consistent upward trend.  The patient does not have symptoms concerning for COVID-19 infection (fever, chills, cough, or new shortness of breath).    Past Medical History:  Diagnosis Date  . Asthma   . Benign essential tremor   . COPD (chronic obstructive pulmonary disease) ([place])   . Essential hypertension,  benign   . Hypertension   . Memory difficulty 12/08/2018  . Paroxysmal atrial fibrillation ([facility])   . Tremor    Past Surgical History:  Procedure Laterality Date  . ANTERIOR AND POSTERIOR REPAIR N/A 02/07/2013   Procedure: ANTERIOR (CYSTOCELE) AND POSTERIOR REPAIR (RECTOCELE);  Surgeon: Jonnie Kind, MD;  Location: [facility];  Service: Gynecology;  Laterality: N/A;  . Bladder Tack    . BREAST BIOPSY Left 10 yrs ago   Dr Marolyn Hammock  . COLONOSCOPY N/A 12/29/2012   Procedure: COLONOSCOPY;  Surgeon: Rogene Houston, MD;  Location: [facility];  Service: Endoscopy;  Laterality: N/A;  12:15  . CYSTOSCOPY N/A 02/07/2013   Procedure: CYSTOSCOPY;  Surgeon: Jonnie Kind, MD;  Location: [facility];  Service: Gynecology;  Laterality: N/A;  . SALPINGOOPHORECTOMY Bilateral 02/07/2013   Procedure: SALPINGO OOPHORECTOMY;  Surgeon: Jonnie Kind, MD;  Location: [facility];  Service: Gynecology;  Laterality: Bilateral;  . VAGINAL HYSTERECTOMY N/A 02/07/2013   Procedure: HYSTERECTOMY VAGINAL;  Surgeon: Jonnie Kind, MD;  Location: [facility];  Service: Gynecology;  Laterality: N/A;     Current Meds  Medication Sig  . apixaban (ELIQUIS) 5 MG TABS tablet Take 1 tablet (5 mg total) by mouth 2 (two) times daily.  . budesonide-formoterol (SYMBICORT) 160-4.5 MCG/ACT inhaler Inhale 2 puffs into the lungs 2 (two) times daily.    Marland Kitchen diltiazem (CARDIZEM CD) 240 MG 24 hr capsule Take 240 mg by mouth every morning.   . escitalopram (LEXAPRO) 10 MG tablet Take 10 mg by mouth  every evening.   Marland Kitchen levocetirizine (XYZAL) 5 MG tablet Take 5 mg by mouth every evening.   Marland Kitchen LORazepam (ATIVAN) 0.5 MG tablet TK 1/2 TO 1 T PO QHS PRN FOR AGITATION  . memantine (NAMENDA) 5 MG tablet Take 1 tablet daily for one week, then take 1 tablet twice daily for one week, then take 1 tablet in the morning and 2 in the evening for one week, then take 2 tablets twice daily  . montelukast (SINGULAIR) 10 MG tablet Take 10 mg by mouth at bedtime.    .  Multiple Vitamins-Minerals (MULTIVITAMIN WITH MINERALS) tablet Take 1 tablet by mouth daily.    Marland Kitchen MYRBETRIQ 25 MG TB24 tablet Take 25 mg by mouth every morning.   Marland Kitchen PROAIR HFA 108 (90 Base) MCG/ACT inhaler Inhale 1-2 puffs into the lungs every 6 (six) hours as needed for wheezing or shortness of breath.   . propranolol (INDERAL) 20 MG tablet Take 20 mg by mouth 2 (two) times daily.   . rosuvastatin (CRESTOR) 10 MG tablet Take 10 mg by mouth every evening.   . topiramate (TOPAMAX) 25 MG tablet Take 25 mg by mouth every evening.      Allergies:   Aricept [donepezil hcl]; Ibuprofen; Penicillins; Codeine; and Exelon [rivastigmine tartrate]   Social History   Tobacco Use  . Smoking status: Former Smoker    Packs/day: 1.00    Years: 20.00    Pack years: 20.00    Types: Cigarettes    Start date: 03/08/1952    Last attempt to quit: 01/07/1991    Years since quitting: 28.1  . Smokeless tobacco: Never Used  Substance Use Topics  . Alcohol use: No    Alcohol/week: 0.0 standard drinks  . Drug use: No     Family Hx: The patient's family history includes Breast cancer in her mother; Cancer in her brother; Diabetes in her son; Heart attack in her father; Heart disease in her father; Tremor in her brother, father, and another family member.  ROS:   Please see the history of present illness.    Memory loss. All other systems reviewed and are negative.   Prior CV studies:   The following studies were reviewed today:  No interval cardiac testing.  Labs/Other Tests and Data Reviewed:    EKG:  An ECG dated 12/06/2018 was personally reviewed today and demonstrated:  Sinus bradycardia.  Recent Labs: 12/05/2018: BUN 13; Creatinine, Ser 0.55; Hemoglobin 13.2; Platelets 313; Potassium 3.8; Sodium 139    Wt Readings from Last 3 Encounters:  02/27/19 134 lb (60.8 kg)  01/12/19 143 lb (64.9 kg)  12/08/18 135 lb (61.2 kg)     Objective:    Vital Signs:  BP (!) 156/76   Pulse 74   Ht 5'  (1.524 m)   Wt 134 lb (60.8 kg)   BMI 26.17 kg/m    General: Patient appears comfortable at rest, seated in her home. HEENT: Conjunctiva and lids normal. Lungs: Patient spoke in full sentences, no audible wheezing, no shortness of breath. Skin: Normal color and turgor appearance. Neuropsychiatric: Gaze conjugate.  Voice tone and speech pattern normal.  Patient moves all extremities.  Affect appropriate.  ASSESSMENT & PLAN:    1.  Paroxysmal atrial fibrillation.  She is doing well without progressive palpitations and continues on Cardizem CD.  She is switching from Coumadin to Eliquis as detailed above.  2.  Essential hypertension, she continues on medical regimen as outlined above.  Continue to check blood  pressure periodically and follow-up with Dr. Luan Pulling.  COVID-19 Education: The signs and symptoms of COVID-19 were discussed with the patient and how to seek care for testing (follow up with PCP or arrange E-visit).  The importance of social distancing was discussed today.  Time:   Today, I have spent 8 minutes with the patient with telehealth technology discussing the above problems.     Medication Adjustments/Labs and Tests Ordered: Current medicines are reviewed at length with the patient today.  Concerns regarding medicines are outlined above.   Tests Ordered: No orders of the defined types were placed in this encounter.   Medication Changes: No orders of the defined types were placed in this encounter.   Disposition:  Follow up 6 months in the [place] office.  Signed, Rozann Lesches, MD  02/27/2019 10:26 AM    [place]

## 2019-02-27 ENCOUNTER — Encounter: Payer: Self-pay | Admitting: Cardiology

## 2019-02-27 ENCOUNTER — Other Ambulatory Visit: Payer: Self-pay

## 2019-02-27 ENCOUNTER — Telehealth (INDEPENDENT_AMBULATORY_CARE_PROVIDER_SITE_OTHER): Payer: Medicare Other | Admitting: Cardiology

## 2019-02-27 ENCOUNTER — Ambulatory Visit (INDEPENDENT_AMBULATORY_CARE_PROVIDER_SITE_OTHER): Payer: Medicare Other | Admitting: *Deleted

## 2019-02-27 VITALS — BP 156/76 | HR 74 | Ht 60.0 in | Wt 134.0 lb

## 2019-02-27 DIAGNOSIS — Z7189 Other specified counseling: Secondary | ICD-10-CM

## 2019-02-27 DIAGNOSIS — I480 Paroxysmal atrial fibrillation: Secondary | ICD-10-CM

## 2019-02-27 DIAGNOSIS — Z7901 Long term (current) use of anticoagulants: Secondary | ICD-10-CM | POA: Diagnosis not present

## 2019-02-27 DIAGNOSIS — I10 Essential (primary) hypertension: Secondary | ICD-10-CM | POA: Diagnosis not present

## 2019-02-27 DIAGNOSIS — Z5181 Encounter for therapeutic drug level monitoring: Secondary | ICD-10-CM | POA: Diagnosis not present

## 2019-02-27 DIAGNOSIS — I4891 Unspecified atrial fibrillation: Secondary | ICD-10-CM | POA: Diagnosis not present

## 2019-02-27 LAB — POCT INR: INR: 3 (ref 2.0–3.0)

## 2019-02-27 NOTE — Patient Instructions (Signed)
Medication Instructions: Your physician recommends that you continue on your current medications as directed. Please refer to the Current Medication list given to you today.   Labwork: None today  Procedures/Testing: None today  Follow-Up: 6 months with Dr.McDowell  Any Additional Special Instructions Will Be Listed Below (If Applicable).     If you need a refill on your cardiac medications before your next appointment, please call your pharmacy.   

## 2019-02-27 NOTE — Patient Instructions (Signed)
Switching to eliquis Hold coumadin today and tomorrow.  Start Eliquis 5mg  twice daily on Wednesday.  Eliquis teaching done.  Daughter verbalized understanding. 1 month Eliquis follow up appt made.  Will repeat CBC and BMP at that time. Charolette Forward (niece) Helps patient with meds. [phone]  Call her if pt needs help understanding.

## 2019-02-28 DIAGNOSIS — S81812A Laceration without foreign body, left lower leg, initial encounter: Secondary | ICD-10-CM | POA: Diagnosis not present

## 2019-02-28 DIAGNOSIS — I10 Essential (primary) hypertension: Secondary | ICD-10-CM | POA: Diagnosis not present

## 2019-02-28 DIAGNOSIS — J449 Chronic obstructive pulmonary disease, unspecified: Secondary | ICD-10-CM | POA: Diagnosis not present

## 2019-03-02 ENCOUNTER — Ambulatory Visit: Payer: Medicare Other | Admitting: Cardiology

## 2019-04-05 DIAGNOSIS — G301 Alzheimer's disease with late onset: Secondary | ICD-10-CM | POA: Diagnosis not present

## 2019-04-05 DIAGNOSIS — G250 Essential tremor: Secondary | ICD-10-CM | POA: Diagnosis not present

## 2019-04-05 DIAGNOSIS — G252 Other specified forms of tremor: Secondary | ICD-10-CM | POA: Diagnosis not present

## 2019-04-12 ENCOUNTER — Other Ambulatory Visit: Payer: Self-pay | Admitting: Cardiology

## 2019-04-12 DIAGNOSIS — J441 Chronic obstructive pulmonary disease with (acute) exacerbation: Secondary | ICD-10-CM | POA: Diagnosis not present

## 2019-04-12 DIAGNOSIS — I10 Essential (primary) hypertension: Secondary | ICD-10-CM | POA: Diagnosis not present

## 2019-04-12 DIAGNOSIS — I482 Chronic atrial fibrillation, unspecified: Secondary | ICD-10-CM | POA: Diagnosis not present

## 2019-04-12 DIAGNOSIS — F0390 Unspecified dementia without behavioral disturbance: Secondary | ICD-10-CM | POA: Diagnosis not present

## 2019-04-19 ENCOUNTER — Other Ambulatory Visit: Payer: Self-pay | Admitting: *Deleted

## 2019-04-19 ENCOUNTER — Ambulatory Visit (INDEPENDENT_AMBULATORY_CARE_PROVIDER_SITE_OTHER): Payer: Medicare Other | Admitting: *Deleted

## 2019-04-19 ENCOUNTER — Other Ambulatory Visit: Payer: Self-pay

## 2019-04-19 ENCOUNTER — Other Ambulatory Visit (HOSPITAL_COMMUNITY)
Admission: RE | Admit: 2019-04-19 | Discharge: 2019-04-19 | Disposition: A | Discharge status: Home / Self Care | Payer: Medicare Other | Source: Ambulatory Visit | Attending: Cardiology | Admitting: Cardiology

## 2019-04-19 DIAGNOSIS — Z5181 Encounter for therapeutic drug level monitoring: Secondary | ICD-10-CM

## 2019-04-19 DIAGNOSIS — Z7901 Long term (current) use of anticoagulants: Secondary | ICD-10-CM

## 2019-04-19 DIAGNOSIS — I4891 Unspecified atrial fibrillation: Secondary | ICD-10-CM | POA: Diagnosis not present

## 2019-04-19 DIAGNOSIS — I480 Paroxysmal atrial fibrillation: Secondary | ICD-10-CM | POA: Insufficient documentation

## 2019-04-19 LAB — BASIC METABOLIC PANEL
Anion gap: 14 (ref 5–15)
BUN: 16 mg/dL (ref 8–23)
CO2: 24 mmol/L (ref 22–32)
Calcium: 8.8 mg/dL — ABNORMAL LOW (ref 8.9–10.3)
Chloride: 100 mmol/L (ref 98–111)
Creatinine, Ser: 0.73 mg/dL (ref 0.44–1.00)
GFR calc Af Amer: >60 mL/min (ref >60)
GFR calc non Af Amer: >60 mL/min (ref >60)
Glucose, Bld: 157 mg/dL — ABNORMAL HIGH (ref 70–99)
Potassium: 3.5 mmol/L (ref 3.5–5.1)
Sodium: 138 mmol/L (ref 135–145)

## 2019-04-19 LAB — CBC
HCT: 43.6 % (ref 36.0–46.0)
Hemoglobin: 13.7 g/dL (ref 12.0–15.0)
MCH: 29.8 pg (ref 26.0–34.0)
MCHC: 31.4 g/dL (ref 30.0–36.0)
MCV: 95 fL (ref 80.0–100.0)
Platelets: 263 10*3/uL (ref 150–400)
RBC: 4.59 MIL/uL (ref 3.87–5.11)
RDW: 13.2 % (ref 11.5–15.5)
WBC: 10.9 10*3/uL — ABNORMAL HIGH (ref 4.0–10.5)
nRBC: 0 % (ref 0.0–0.2)

## 2019-04-19 MED ORDER — APIXABAN 5 MG PO TABS: ORAL_TABLET | ORAL | 3 refills | Status: AC

## 2019-04-19 NOTE — Progress Notes (Signed)
Pt previously on warfarin.  Request to change to Eliquis.  Warfarin was discontinued 02/27/19 and she was started on Eliquis 5mg  twice daily on 03/01/19 for PAF .    Labs 12/05/18:  SCr 0.55  Hgb 13.2  Hct 41.9  Plts 313  Wt. 61.2kg   Reviewed patients medication list.  Pt is not currently on any combined P-gp and strong CYP3A4 inhibitors/inducers (ketoconazole, traconazole, ritonavir, carbamazepine, phenytoin, rifampin, St. John's wort).  Reviewed labs from 04/19/19:  SCr 0.73, Weight 60.8kg, CrCl 56.05.  Dose is appropriate based on age, weight, and SCr.  Hgb and HCT:  13.7/43.6  Plts 263  A full discussion of the nature of anticoagulants has been carried out.  A benefit/risk analysis has been presented to the patient, so that they understand the justification for choosing anticoagulation with Eliquis at this time.  The need for compliance is stressed.  Pt is aware to take the medication twice daily.  Side effects of potential bleeding are discussed, including unusual colored urine or stools, coughing up blood or coffee ground emesis, nose bleeds or serious fall or head trauma.  Discussed signs and symptoms of stroke. The patient should avoid any OTC items containing aspirin or ibuprofen.  Avoid alcohol consumption.   Call if any signs of abnormal bleeding.  Discussed financial obligations and resolved any difficulty in obtaining medication.  Next lab test in 6 months.   Labs reviewed by Dr Domenic Polite.  Called pt's son and discussed lab results.  6 month Eliquis follow up put in recall.  Son in agreement.

## 2019-04-26 DIAGNOSIS — Z1159 Encounter for screening for other viral diseases: Secondary | ICD-10-CM | POA: Diagnosis not present

## 2019-04-28 DIAGNOSIS — S50311D Abrasion of right elbow, subsequent encounter: Secondary | ICD-10-CM | POA: Diagnosis not present

## 2019-04-28 DIAGNOSIS — Z9981 Dependence on supplemental oxygen: Secondary | ICD-10-CM | POA: Diagnosis not present

## 2019-04-28 DIAGNOSIS — Z7951 Long term (current) use of inhaled steroids: Secondary | ICD-10-CM | POA: Diagnosis not present

## 2019-04-28 DIAGNOSIS — E785 Hyperlipidemia, unspecified: Secondary | ICD-10-CM | POA: Diagnosis not present

## 2019-04-28 DIAGNOSIS — G309 Alzheimer's disease, unspecified: Secondary | ICD-10-CM | POA: Diagnosis not present

## 2019-04-28 DIAGNOSIS — S80212D Abrasion, left knee, subsequent encounter: Secondary | ICD-10-CM | POA: Diagnosis not present

## 2019-04-28 DIAGNOSIS — I4891 Unspecified atrial fibrillation: Secondary | ICD-10-CM | POA: Diagnosis not present

## 2019-04-28 DIAGNOSIS — S50312D Abrasion of left elbow, subsequent encounter: Secondary | ICD-10-CM | POA: Diagnosis not present

## 2019-04-28 DIAGNOSIS — Z7901 Long term (current) use of anticoagulants: Secondary | ICD-10-CM | POA: Diagnosis not present

## 2019-04-28 DIAGNOSIS — Z9181 History of falling: Secondary | ICD-10-CM | POA: Diagnosis not present

## 2019-04-28 DIAGNOSIS — F411 Generalized anxiety disorder: Secondary | ICD-10-CM | POA: Diagnosis not present

## 2019-04-28 DIAGNOSIS — J45909 Unspecified asthma, uncomplicated: Secondary | ICD-10-CM | POA: Diagnosis not present

## 2019-04-28 DIAGNOSIS — S0012XD Contusion of left eyelid and periocular area, subsequent encounter: Secondary | ICD-10-CM | POA: Diagnosis not present

## 2019-04-28 DIAGNOSIS — F0280 Dementia in other diseases classified elsewhere without behavioral disturbance: Secondary | ICD-10-CM | POA: Diagnosis not present

## 2019-05-01 DIAGNOSIS — G309 Alzheimer's disease, unspecified: Secondary | ICD-10-CM | POA: Diagnosis not present

## 2019-05-01 DIAGNOSIS — S80212D Abrasion, left knee, subsequent encounter: Secondary | ICD-10-CM | POA: Diagnosis not present

## 2019-05-01 DIAGNOSIS — S50312D Abrasion of left elbow, subsequent encounter: Secondary | ICD-10-CM | POA: Diagnosis not present

## 2019-05-01 DIAGNOSIS — S50311D Abrasion of right elbow, subsequent encounter: Secondary | ICD-10-CM | POA: Diagnosis not present

## 2019-05-01 DIAGNOSIS — F0280 Dementia in other diseases classified elsewhere without behavioral disturbance: Secondary | ICD-10-CM | POA: Diagnosis not present

## 2019-05-01 DIAGNOSIS — S0012XD Contusion of left eyelid and periocular area, subsequent encounter: Secondary | ICD-10-CM | POA: Diagnosis not present

## 2019-05-02 DIAGNOSIS — R531 Weakness: Secondary | ICD-10-CM | POA: Diagnosis not present

## 2019-05-02 DIAGNOSIS — I4891 Unspecified atrial fibrillation: Secondary | ICD-10-CM | POA: Diagnosis not present

## 2019-05-02 DIAGNOSIS — J45909 Unspecified asthma, uncomplicated: Secondary | ICD-10-CM | POA: Diagnosis not present

## 2019-05-02 DIAGNOSIS — E785 Hyperlipidemia, unspecified: Secondary | ICD-10-CM | POA: Diagnosis not present

## 2019-05-04 DIAGNOSIS — F0280 Dementia in other diseases classified elsewhere without behavioral disturbance: Secondary | ICD-10-CM | POA: Diagnosis not present

## 2019-05-04 DIAGNOSIS — S50312D Abrasion of left elbow, subsequent encounter: Secondary | ICD-10-CM | POA: Diagnosis not present

## 2019-05-04 DIAGNOSIS — S0012XD Contusion of left eyelid and periocular area, subsequent encounter: Secondary | ICD-10-CM | POA: Diagnosis not present

## 2019-05-04 DIAGNOSIS — S50311D Abrasion of right elbow, subsequent encounter: Secondary | ICD-10-CM | POA: Diagnosis not present

## 2019-05-04 DIAGNOSIS — G309 Alzheimer's disease, unspecified: Secondary | ICD-10-CM | POA: Diagnosis not present

## 2019-05-04 DIAGNOSIS — S80212D Abrasion, left knee, subsequent encounter: Secondary | ICD-10-CM | POA: Diagnosis not present

## 2019-05-05 DIAGNOSIS — S0012XD Contusion of left eyelid and periocular area, subsequent encounter: Secondary | ICD-10-CM | POA: Diagnosis not present

## 2019-05-05 DIAGNOSIS — F0280 Dementia in other diseases classified elsewhere without behavioral disturbance: Secondary | ICD-10-CM | POA: Diagnosis not present

## 2019-05-05 DIAGNOSIS — S50311D Abrasion of right elbow, subsequent encounter: Secondary | ICD-10-CM | POA: Diagnosis not present

## 2019-05-05 DIAGNOSIS — G309 Alzheimer's disease, unspecified: Secondary | ICD-10-CM | POA: Diagnosis not present

## 2019-05-05 DIAGNOSIS — S80212D Abrasion, left knee, subsequent encounter: Secondary | ICD-10-CM | POA: Diagnosis not present

## 2019-05-05 DIAGNOSIS — S50312D Abrasion of left elbow, subsequent encounter: Secondary | ICD-10-CM | POA: Diagnosis not present

## 2019-05-08 DIAGNOSIS — S80212D Abrasion, left knee, subsequent encounter: Secondary | ICD-10-CM | POA: Diagnosis not present

## 2019-05-08 DIAGNOSIS — F0280 Dementia in other diseases classified elsewhere without behavioral disturbance: Secondary | ICD-10-CM | POA: Diagnosis not present

## 2019-05-08 DIAGNOSIS — S0012XD Contusion of left eyelid and periocular area, subsequent encounter: Secondary | ICD-10-CM | POA: Diagnosis not present

## 2019-05-08 DIAGNOSIS — S50311D Abrasion of right elbow, subsequent encounter: Secondary | ICD-10-CM | POA: Diagnosis not present

## 2019-05-08 DIAGNOSIS — S50312D Abrasion of left elbow, subsequent encounter: Secondary | ICD-10-CM | POA: Diagnosis not present

## 2019-05-08 DIAGNOSIS — G309 Alzheimer's disease, unspecified: Secondary | ICD-10-CM | POA: Diagnosis not present

## 2019-05-09 DIAGNOSIS — J45909 Unspecified asthma, uncomplicated: Secondary | ICD-10-CM | POA: Diagnosis not present

## 2019-05-09 DIAGNOSIS — S80212D Abrasion, left knee, subsequent encounter: Secondary | ICD-10-CM | POA: Diagnosis not present

## 2019-05-09 DIAGNOSIS — F0390 Unspecified dementia without behavioral disturbance: Secondary | ICD-10-CM | POA: Diagnosis not present

## 2019-05-09 DIAGNOSIS — F419 Anxiety disorder, unspecified: Secondary | ICD-10-CM | POA: Diagnosis not present

## 2019-05-09 DIAGNOSIS — G309 Alzheimer's disease, unspecified: Secondary | ICD-10-CM | POA: Diagnosis not present

## 2019-05-09 DIAGNOSIS — R531 Weakness: Secondary | ICD-10-CM | POA: Diagnosis not present

## 2019-05-09 DIAGNOSIS — S50312D Abrasion of left elbow, subsequent encounter: Secondary | ICD-10-CM | POA: Diagnosis not present

## 2019-05-09 DIAGNOSIS — R0600 Dyspnea, unspecified: Secondary | ICD-10-CM | POA: Diagnosis not present

## 2019-05-09 DIAGNOSIS — E785 Hyperlipidemia, unspecified: Secondary | ICD-10-CM | POA: Diagnosis not present

## 2019-05-09 DIAGNOSIS — S50311D Abrasion of right elbow, subsequent encounter: Secondary | ICD-10-CM | POA: Diagnosis not present

## 2019-05-09 DIAGNOSIS — F0280 Dementia in other diseases classified elsewhere without behavioral disturbance: Secondary | ICD-10-CM | POA: Diagnosis not present

## 2019-05-09 DIAGNOSIS — E559 Vitamin D deficiency, unspecified: Secondary | ICD-10-CM | POA: Diagnosis not present

## 2019-05-09 DIAGNOSIS — S0012XD Contusion of left eyelid and periocular area, subsequent encounter: Secondary | ICD-10-CM | POA: Diagnosis not present

## 2019-05-12 DIAGNOSIS — J45909 Unspecified asthma, uncomplicated: Secondary | ICD-10-CM | POA: Diagnosis not present

## 2019-05-12 DIAGNOSIS — E785 Hyperlipidemia, unspecified: Secondary | ICD-10-CM | POA: Diagnosis not present

## 2019-05-12 DIAGNOSIS — F419 Anxiety disorder, unspecified: Secondary | ICD-10-CM | POA: Diagnosis not present

## 2019-05-12 DIAGNOSIS — J449 Chronic obstructive pulmonary disease, unspecified: Secondary | ICD-10-CM | POA: Diagnosis not present

## 2019-05-12 DIAGNOSIS — G4700 Insomnia, unspecified: Secondary | ICD-10-CM | POA: Diagnosis not present

## 2019-05-12 DIAGNOSIS — I10 Essential (primary) hypertension: Secondary | ICD-10-CM | POA: Diagnosis not present

## 2019-05-16 DIAGNOSIS — S50311D Abrasion of right elbow, subsequent encounter: Secondary | ICD-10-CM | POA: Diagnosis not present

## 2019-05-16 DIAGNOSIS — F0280 Dementia in other diseases classified elsewhere without behavioral disturbance: Secondary | ICD-10-CM | POA: Diagnosis not present

## 2019-05-16 DIAGNOSIS — S0012XD Contusion of left eyelid and periocular area, subsequent encounter: Secondary | ICD-10-CM | POA: Diagnosis not present

## 2019-05-16 DIAGNOSIS — S80212D Abrasion, left knee, subsequent encounter: Secondary | ICD-10-CM | POA: Diagnosis not present

## 2019-05-16 DIAGNOSIS — S50312D Abrasion of left elbow, subsequent encounter: Secondary | ICD-10-CM | POA: Diagnosis not present

## 2019-05-16 DIAGNOSIS — G309 Alzheimer's disease, unspecified: Secondary | ICD-10-CM | POA: Diagnosis not present

## 2019-05-18 DIAGNOSIS — S50312D Abrasion of left elbow, subsequent encounter: Secondary | ICD-10-CM | POA: Diagnosis not present

## 2019-05-18 DIAGNOSIS — S80212D Abrasion, left knee, subsequent encounter: Secondary | ICD-10-CM | POA: Diagnosis not present

## 2019-05-18 DIAGNOSIS — F0280 Dementia in other diseases classified elsewhere without behavioral disturbance: Secondary | ICD-10-CM | POA: Diagnosis not present

## 2019-05-18 DIAGNOSIS — S0012XD Contusion of left eyelid and periocular area, subsequent encounter: Secondary | ICD-10-CM | POA: Diagnosis not present

## 2019-05-18 DIAGNOSIS — S50311D Abrasion of right elbow, subsequent encounter: Secondary | ICD-10-CM | POA: Diagnosis not present

## 2019-05-18 DIAGNOSIS — G309 Alzheimer's disease, unspecified: Secondary | ICD-10-CM | POA: Diagnosis not present

## 2019-05-25 DIAGNOSIS — S50312D Abrasion of left elbow, subsequent encounter: Secondary | ICD-10-CM | POA: Diagnosis not present

## 2019-05-25 DIAGNOSIS — S50311D Abrasion of right elbow, subsequent encounter: Secondary | ICD-10-CM | POA: Diagnosis not present

## 2019-05-25 DIAGNOSIS — G309 Alzheimer's disease, unspecified: Secondary | ICD-10-CM | POA: Diagnosis not present

## 2019-05-25 DIAGNOSIS — S0012XD Contusion of left eyelid and periocular area, subsequent encounter: Secondary | ICD-10-CM | POA: Diagnosis not present

## 2019-05-25 DIAGNOSIS — S80212D Abrasion, left knee, subsequent encounter: Secondary | ICD-10-CM | POA: Diagnosis not present

## 2019-05-25 DIAGNOSIS — F0280 Dementia in other diseases classified elsewhere without behavioral disturbance: Secondary | ICD-10-CM | POA: Diagnosis not present

## 2019-05-28 DIAGNOSIS — Z9981 Dependence on supplemental oxygen: Secondary | ICD-10-CM | POA: Diagnosis not present

## 2019-05-28 DIAGNOSIS — E785 Hyperlipidemia, unspecified: Secondary | ICD-10-CM | POA: Diagnosis not present

## 2019-05-28 DIAGNOSIS — G309 Alzheimer's disease, unspecified: Secondary | ICD-10-CM | POA: Diagnosis not present

## 2019-05-28 DIAGNOSIS — F0280 Dementia in other diseases classified elsewhere without behavioral disturbance: Secondary | ICD-10-CM | POA: Diagnosis not present

## 2019-05-28 DIAGNOSIS — F411 Generalized anxiety disorder: Secondary | ICD-10-CM | POA: Diagnosis not present

## 2019-05-28 DIAGNOSIS — S80212D Abrasion, left knee, subsequent encounter: Secondary | ICD-10-CM | POA: Diagnosis not present

## 2019-05-28 DIAGNOSIS — Z7901 Long term (current) use of anticoagulants: Secondary | ICD-10-CM | POA: Diagnosis not present

## 2019-05-28 DIAGNOSIS — S50311D Abrasion of right elbow, subsequent encounter: Secondary | ICD-10-CM | POA: Diagnosis not present

## 2019-05-28 DIAGNOSIS — S50312D Abrasion of left elbow, subsequent encounter: Secondary | ICD-10-CM | POA: Diagnosis not present

## 2019-05-28 DIAGNOSIS — S0012XD Contusion of left eyelid and periocular area, subsequent encounter: Secondary | ICD-10-CM | POA: Diagnosis not present

## 2019-05-28 DIAGNOSIS — Z7951 Long term (current) use of inhaled steroids: Secondary | ICD-10-CM | POA: Diagnosis not present

## 2019-05-28 DIAGNOSIS — I4891 Unspecified atrial fibrillation: Secondary | ICD-10-CM | POA: Diagnosis not present

## 2019-05-28 DIAGNOSIS — J45909 Unspecified asthma, uncomplicated: Secondary | ICD-10-CM | POA: Diagnosis not present

## 2019-05-28 DIAGNOSIS — Z9181 History of falling: Secondary | ICD-10-CM | POA: Diagnosis not present

## 2019-05-30 DIAGNOSIS — S80212D Abrasion, left knee, subsequent encounter: Secondary | ICD-10-CM | POA: Diagnosis not present

## 2019-05-30 DIAGNOSIS — G309 Alzheimer's disease, unspecified: Secondary | ICD-10-CM | POA: Diagnosis not present

## 2019-05-30 DIAGNOSIS — S50311D Abrasion of right elbow, subsequent encounter: Secondary | ICD-10-CM | POA: Diagnosis not present

## 2019-05-30 DIAGNOSIS — S50312D Abrasion of left elbow, subsequent encounter: Secondary | ICD-10-CM | POA: Diagnosis not present

## 2019-05-30 DIAGNOSIS — S42301A Unspecified fracture of shaft of humerus, right arm, initial encounter for closed fracture: Secondary | ICD-10-CM | POA: Diagnosis not present

## 2019-05-30 DIAGNOSIS — M79601 Pain in right arm: Secondary | ICD-10-CM | POA: Diagnosis not present

## 2019-05-30 DIAGNOSIS — R2231 Localized swelling, mass and lump, right upper limb: Secondary | ICD-10-CM | POA: Diagnosis not present

## 2019-05-30 DIAGNOSIS — S0012XD Contusion of left eyelid and periocular area, subsequent encounter: Secondary | ICD-10-CM | POA: Diagnosis not present

## 2019-05-30 DIAGNOSIS — F0390 Unspecified dementia without behavioral disturbance: Secondary | ICD-10-CM | POA: Diagnosis not present

## 2019-05-30 DIAGNOSIS — F0280 Dementia in other diseases classified elsewhere without behavioral disturbance: Secondary | ICD-10-CM | POA: Diagnosis not present

## 2019-06-01 DIAGNOSIS — S80212D Abrasion, left knee, subsequent encounter: Secondary | ICD-10-CM | POA: Diagnosis not present

## 2019-06-01 DIAGNOSIS — F0280 Dementia in other diseases classified elsewhere without behavioral disturbance: Secondary | ICD-10-CM | POA: Diagnosis not present

## 2019-06-01 DIAGNOSIS — S0012XD Contusion of left eyelid and periocular area, subsequent encounter: Secondary | ICD-10-CM | POA: Diagnosis not present

## 2019-06-01 DIAGNOSIS — G309 Alzheimer's disease, unspecified: Secondary | ICD-10-CM | POA: Diagnosis not present

## 2019-06-01 DIAGNOSIS — S50312D Abrasion of left elbow, subsequent encounter: Secondary | ICD-10-CM | POA: Diagnosis not present

## 2019-06-01 DIAGNOSIS — S50311D Abrasion of right elbow, subsequent encounter: Secondary | ICD-10-CM | POA: Diagnosis not present

## 2019-06-05 DIAGNOSIS — W19XXXA Unspecified fall, initial encounter: Secondary | ICD-10-CM | POA: Diagnosis not present

## 2019-06-05 DIAGNOSIS — S52591A Other fractures of lower end of right radius, initial encounter for closed fracture: Secondary | ICD-10-CM | POA: Diagnosis not present

## 2019-06-05 DIAGNOSIS — Z5321 Procedure and treatment not carried out due to patient leaving prior to being seen by health care provider: Secondary | ICD-10-CM | POA: Diagnosis not present

## 2019-06-05 DIAGNOSIS — M25531 Pain in right wrist: Secondary | ICD-10-CM | POA: Diagnosis not present

## 2019-06-06 DIAGNOSIS — I10 Essential (primary) hypertension: Secondary | ICD-10-CM | POA: Diagnosis not present

## 2019-06-06 DIAGNOSIS — G309 Alzheimer's disease, unspecified: Secondary | ICD-10-CM | POA: Diagnosis not present

## 2019-06-06 DIAGNOSIS — F329 Major depressive disorder, single episode, unspecified: Secondary | ICD-10-CM | POA: Diagnosis not present

## 2019-06-06 DIAGNOSIS — R413 Other amnesia: Secondary | ICD-10-CM | POA: Diagnosis not present

## 2019-06-08 DIAGNOSIS — S52591D Other fractures of lower end of right radius, subsequent encounter for closed fracture with routine healing: Secondary | ICD-10-CM | POA: Diagnosis not present

## 2019-06-09 ENCOUNTER — Ambulatory Visit: Payer: Medicare Other | Admitting: Neurology

## 2019-06-13 DIAGNOSIS — Z6823 Body mass index (BMI) 23.0-23.9, adult: Secondary | ICD-10-CM | POA: Diagnosis not present

## 2019-06-16 DIAGNOSIS — G309 Alzheimer's disease, unspecified: Secondary | ICD-10-CM | POA: Diagnosis not present

## 2019-06-16 DIAGNOSIS — S80212D Abrasion, left knee, subsequent encounter: Secondary | ICD-10-CM | POA: Diagnosis not present

## 2019-06-16 DIAGNOSIS — F0280 Dementia in other diseases classified elsewhere without behavioral disturbance: Secondary | ICD-10-CM | POA: Diagnosis not present

## 2019-06-16 DIAGNOSIS — S0012XD Contusion of left eyelid and periocular area, subsequent encounter: Secondary | ICD-10-CM | POA: Diagnosis not present

## 2019-06-16 DIAGNOSIS — S50312D Abrasion of left elbow, subsequent encounter: Secondary | ICD-10-CM | POA: Diagnosis not present

## 2019-06-16 DIAGNOSIS — S50311D Abrasion of right elbow, subsequent encounter: Secondary | ICD-10-CM | POA: Diagnosis not present

## 2019-06-22 DIAGNOSIS — S50312D Abrasion of left elbow, subsequent encounter: Secondary | ICD-10-CM | POA: Diagnosis not present

## 2019-06-22 DIAGNOSIS — S0012XD Contusion of left eyelid and periocular area, subsequent encounter: Secondary | ICD-10-CM | POA: Diagnosis not present

## 2019-06-22 DIAGNOSIS — S50311D Abrasion of right elbow, subsequent encounter: Secondary | ICD-10-CM | POA: Diagnosis not present

## 2019-06-22 DIAGNOSIS — S80212D Abrasion, left knee, subsequent encounter: Secondary | ICD-10-CM | POA: Diagnosis not present

## 2019-06-22 DIAGNOSIS — F0280 Dementia in other diseases classified elsewhere without behavioral disturbance: Secondary | ICD-10-CM | POA: Diagnosis not present

## 2019-06-22 DIAGNOSIS — G309 Alzheimer's disease, unspecified: Secondary | ICD-10-CM | POA: Diagnosis not present

## 2019-07-13 DIAGNOSIS — G301 Alzheimer's disease with late onset: Secondary | ICD-10-CM | POA: Diagnosis not present

## 2019-07-13 DIAGNOSIS — G252 Other specified forms of tremor: Secondary | ICD-10-CM | POA: Diagnosis not present

## 2019-07-13 DIAGNOSIS — G250 Essential tremor: Secondary | ICD-10-CM | POA: Diagnosis not present

## 2019-07-15 DIAGNOSIS — E785 Hyperlipidemia, unspecified: Secondary | ICD-10-CM | POA: Diagnosis not present

## 2019-07-15 DIAGNOSIS — F0280 Dementia in other diseases classified elsewhere without behavioral disturbance: Secondary | ICD-10-CM | POA: Diagnosis not present

## 2019-07-15 DIAGNOSIS — J45909 Unspecified asthma, uncomplicated: Secondary | ICD-10-CM | POA: Diagnosis not present

## 2019-07-15 DIAGNOSIS — G309 Alzheimer's disease, unspecified: Secondary | ICD-10-CM | POA: Diagnosis not present

## 2019-07-15 DIAGNOSIS — Z9181 History of falling: Secondary | ICD-10-CM | POA: Diagnosis not present

## 2019-07-15 DIAGNOSIS — Z7901 Long term (current) use of anticoagulants: Secondary | ICD-10-CM | POA: Diagnosis not present

## 2019-07-15 DIAGNOSIS — Z7951 Long term (current) use of inhaled steroids: Secondary | ICD-10-CM | POA: Diagnosis not present

## 2019-07-15 DIAGNOSIS — Z9981 Dependence on supplemental oxygen: Secondary | ICD-10-CM | POA: Diagnosis not present

## 2019-07-15 DIAGNOSIS — F411 Generalized anxiety disorder: Secondary | ICD-10-CM | POA: Diagnosis not present

## 2019-07-15 DIAGNOSIS — I4891 Unspecified atrial fibrillation: Secondary | ICD-10-CM | POA: Diagnosis not present

## 2019-07-15 DIAGNOSIS — I10 Essential (primary) hypertension: Secondary | ICD-10-CM | POA: Diagnosis not present

## 2019-07-17 DIAGNOSIS — J45909 Unspecified asthma, uncomplicated: Secondary | ICD-10-CM | POA: Diagnosis not present

## 2019-07-17 DIAGNOSIS — F0280 Dementia in other diseases classified elsewhere without behavioral disturbance: Secondary | ICD-10-CM | POA: Diagnosis not present

## 2019-07-17 DIAGNOSIS — F411 Generalized anxiety disorder: Secondary | ICD-10-CM | POA: Diagnosis not present

## 2019-07-17 DIAGNOSIS — I4891 Unspecified atrial fibrillation: Secondary | ICD-10-CM | POA: Diagnosis not present

## 2019-07-17 DIAGNOSIS — I10 Essential (primary) hypertension: Secondary | ICD-10-CM | POA: Diagnosis not present

## 2019-07-17 DIAGNOSIS — G309 Alzheimer's disease, unspecified: Secondary | ICD-10-CM | POA: Diagnosis not present

## 2019-07-21 DIAGNOSIS — I10 Essential (primary) hypertension: Secondary | ICD-10-CM | POA: Diagnosis not present

## 2019-07-21 DIAGNOSIS — J45909 Unspecified asthma, uncomplicated: Secondary | ICD-10-CM | POA: Diagnosis not present

## 2019-07-21 DIAGNOSIS — I4891 Unspecified atrial fibrillation: Secondary | ICD-10-CM | POA: Diagnosis not present

## 2019-07-21 DIAGNOSIS — F0280 Dementia in other diseases classified elsewhere without behavioral disturbance: Secondary | ICD-10-CM | POA: Diagnosis not present

## 2019-07-21 DIAGNOSIS — G309 Alzheimer's disease, unspecified: Secondary | ICD-10-CM | POA: Diagnosis not present

## 2019-07-21 DIAGNOSIS — F411 Generalized anxiety disorder: Secondary | ICD-10-CM | POA: Diagnosis not present

## 2019-07-24 DIAGNOSIS — F0280 Dementia in other diseases classified elsewhere without behavioral disturbance: Secondary | ICD-10-CM | POA: Diagnosis not present

## 2019-07-24 DIAGNOSIS — I4891 Unspecified atrial fibrillation: Secondary | ICD-10-CM | POA: Diagnosis not present

## 2019-07-24 DIAGNOSIS — I10 Essential (primary) hypertension: Secondary | ICD-10-CM | POA: Diagnosis not present

## 2019-07-24 DIAGNOSIS — F411 Generalized anxiety disorder: Secondary | ICD-10-CM | POA: Diagnosis not present

## 2019-07-24 DIAGNOSIS — J45909 Unspecified asthma, uncomplicated: Secondary | ICD-10-CM | POA: Diagnosis not present

## 2019-07-24 DIAGNOSIS — G309 Alzheimer's disease, unspecified: Secondary | ICD-10-CM | POA: Diagnosis not present

## 2019-07-25 DIAGNOSIS — E785 Hyperlipidemia, unspecified: Secondary | ICD-10-CM | POA: Diagnosis not present

## 2019-07-25 DIAGNOSIS — F0390 Unspecified dementia without behavioral disturbance: Secondary | ICD-10-CM | POA: Diagnosis not present

## 2019-07-25 DIAGNOSIS — I4891 Unspecified atrial fibrillation: Secondary | ICD-10-CM | POA: Diagnosis not present

## 2019-07-25 DIAGNOSIS — J45909 Unspecified asthma, uncomplicated: Secondary | ICD-10-CM | POA: Diagnosis not present

## 2019-07-28 DIAGNOSIS — G309 Alzheimer's disease, unspecified: Secondary | ICD-10-CM | POA: Diagnosis not present

## 2019-07-28 DIAGNOSIS — F0280 Dementia in other diseases classified elsewhere without behavioral disturbance: Secondary | ICD-10-CM | POA: Diagnosis not present

## 2019-07-28 DIAGNOSIS — F411 Generalized anxiety disorder: Secondary | ICD-10-CM | POA: Diagnosis not present

## 2019-07-28 DIAGNOSIS — J45909 Unspecified asthma, uncomplicated: Secondary | ICD-10-CM | POA: Diagnosis not present

## 2019-07-28 DIAGNOSIS — I4891 Unspecified atrial fibrillation: Secondary | ICD-10-CM | POA: Diagnosis not present

## 2019-07-28 DIAGNOSIS — I10 Essential (primary) hypertension: Secondary | ICD-10-CM | POA: Diagnosis not present

## 2019-07-31 DIAGNOSIS — I10 Essential (primary) hypertension: Secondary | ICD-10-CM | POA: Diagnosis not present

## 2019-07-31 DIAGNOSIS — F0280 Dementia in other diseases classified elsewhere without behavioral disturbance: Secondary | ICD-10-CM | POA: Diagnosis not present

## 2019-07-31 DIAGNOSIS — J45909 Unspecified asthma, uncomplicated: Secondary | ICD-10-CM | POA: Diagnosis not present

## 2019-07-31 DIAGNOSIS — F411 Generalized anxiety disorder: Secondary | ICD-10-CM | POA: Diagnosis not present

## 2019-07-31 DIAGNOSIS — G309 Alzheimer's disease, unspecified: Secondary | ICD-10-CM | POA: Diagnosis not present

## 2019-07-31 DIAGNOSIS — I4891 Unspecified atrial fibrillation: Secondary | ICD-10-CM | POA: Diagnosis not present

## 2019-08-04 DIAGNOSIS — F411 Generalized anxiety disorder: Secondary | ICD-10-CM | POA: Diagnosis not present

## 2019-08-04 DIAGNOSIS — I4891 Unspecified atrial fibrillation: Secondary | ICD-10-CM | POA: Diagnosis not present

## 2019-08-04 DIAGNOSIS — I10 Essential (primary) hypertension: Secondary | ICD-10-CM | POA: Diagnosis not present

## 2019-08-04 DIAGNOSIS — F0280 Dementia in other diseases classified elsewhere without behavioral disturbance: Secondary | ICD-10-CM | POA: Diagnosis not present

## 2019-08-04 DIAGNOSIS — J45909 Unspecified asthma, uncomplicated: Secondary | ICD-10-CM | POA: Diagnosis not present

## 2019-08-04 DIAGNOSIS — G309 Alzheimer's disease, unspecified: Secondary | ICD-10-CM | POA: Diagnosis not present

## 2019-08-10 DIAGNOSIS — G309 Alzheimer's disease, unspecified: Secondary | ICD-10-CM | POA: Diagnosis not present

## 2019-08-10 DIAGNOSIS — J45909 Unspecified asthma, uncomplicated: Secondary | ICD-10-CM | POA: Diagnosis not present

## 2019-08-10 DIAGNOSIS — I10 Essential (primary) hypertension: Secondary | ICD-10-CM | POA: Diagnosis not present

## 2019-08-10 DIAGNOSIS — I4891 Unspecified atrial fibrillation: Secondary | ICD-10-CM | POA: Diagnosis not present

## 2019-08-10 DIAGNOSIS — F411 Generalized anxiety disorder: Secondary | ICD-10-CM | POA: Diagnosis not present

## 2019-08-10 DIAGNOSIS — F0280 Dementia in other diseases classified elsewhere without behavioral disturbance: Secondary | ICD-10-CM | POA: Diagnosis not present

## 2019-08-11 DIAGNOSIS — J45909 Unspecified asthma, uncomplicated: Secondary | ICD-10-CM | POA: Diagnosis not present

## 2019-08-11 DIAGNOSIS — F0280 Dementia in other diseases classified elsewhere without behavioral disturbance: Secondary | ICD-10-CM | POA: Diagnosis not present

## 2019-08-11 DIAGNOSIS — G309 Alzheimer's disease, unspecified: Secondary | ICD-10-CM | POA: Diagnosis not present

## 2019-08-11 DIAGNOSIS — F411 Generalized anxiety disorder: Secondary | ICD-10-CM | POA: Diagnosis not present

## 2019-08-11 DIAGNOSIS — I10 Essential (primary) hypertension: Secondary | ICD-10-CM | POA: Diagnosis not present

## 2019-08-11 DIAGNOSIS — I4891 Unspecified atrial fibrillation: Secondary | ICD-10-CM | POA: Diagnosis not present

## 2019-08-14 DIAGNOSIS — I4891 Unspecified atrial fibrillation: Secondary | ICD-10-CM | POA: Diagnosis not present

## 2019-08-14 DIAGNOSIS — Z7951 Long term (current) use of inhaled steroids: Secondary | ICD-10-CM | POA: Diagnosis not present

## 2019-08-14 DIAGNOSIS — J45909 Unspecified asthma, uncomplicated: Secondary | ICD-10-CM | POA: Diagnosis not present

## 2019-08-14 DIAGNOSIS — Z7901 Long term (current) use of anticoagulants: Secondary | ICD-10-CM | POA: Diagnosis not present

## 2019-08-14 DIAGNOSIS — Z9181 History of falling: Secondary | ICD-10-CM | POA: Diagnosis not present

## 2019-08-14 DIAGNOSIS — Z9981 Dependence on supplemental oxygen: Secondary | ICD-10-CM | POA: Diagnosis not present

## 2019-08-14 DIAGNOSIS — E785 Hyperlipidemia, unspecified: Secondary | ICD-10-CM | POA: Diagnosis not present

## 2019-08-14 DIAGNOSIS — F411 Generalized anxiety disorder: Secondary | ICD-10-CM | POA: Diagnosis not present

## 2019-08-14 DIAGNOSIS — F0280 Dementia in other diseases classified elsewhere without behavioral disturbance: Secondary | ICD-10-CM | POA: Diagnosis not present

## 2019-08-14 DIAGNOSIS — I10 Essential (primary) hypertension: Secondary | ICD-10-CM | POA: Diagnosis not present

## 2019-08-14 DIAGNOSIS — G309 Alzheimer's disease, unspecified: Secondary | ICD-10-CM | POA: Diagnosis not present

## 2019-08-17 DIAGNOSIS — F0280 Dementia in other diseases classified elsewhere without behavioral disturbance: Secondary | ICD-10-CM | POA: Diagnosis not present

## 2019-08-17 DIAGNOSIS — I10 Essential (primary) hypertension: Secondary | ICD-10-CM | POA: Diagnosis not present

## 2019-08-17 DIAGNOSIS — G309 Alzheimer's disease, unspecified: Secondary | ICD-10-CM | POA: Diagnosis not present

## 2019-08-17 DIAGNOSIS — J45909 Unspecified asthma, uncomplicated: Secondary | ICD-10-CM | POA: Diagnosis not present

## 2019-08-17 DIAGNOSIS — I4891 Unspecified atrial fibrillation: Secondary | ICD-10-CM | POA: Diagnosis not present

## 2019-08-17 DIAGNOSIS — F411 Generalized anxiety disorder: Secondary | ICD-10-CM | POA: Diagnosis not present

## 2019-08-23 DIAGNOSIS — F0390 Unspecified dementia without behavioral disturbance: Secondary | ICD-10-CM | POA: Diagnosis not present

## 2019-08-23 DIAGNOSIS — F419 Anxiety disorder, unspecified: Secondary | ICD-10-CM | POA: Diagnosis not present

## 2019-08-23 DIAGNOSIS — Z9181 History of falling: Secondary | ICD-10-CM | POA: Diagnosis not present

## 2019-08-23 DIAGNOSIS — R293 Abnormal posture: Secondary | ICD-10-CM | POA: Diagnosis not present

## 2019-08-23 DIAGNOSIS — M6281 Muscle weakness (generalized): Secondary | ICD-10-CM | POA: Diagnosis not present

## 2019-08-23 DIAGNOSIS — R278 Other lack of coordination: Secondary | ICD-10-CM | POA: Diagnosis not present

## 2019-08-23 DIAGNOSIS — R251 Tremor, unspecified: Secondary | ICD-10-CM | POA: Diagnosis not present

## 2019-08-23 DIAGNOSIS — G309 Alzheimer's disease, unspecified: Secondary | ICD-10-CM | POA: Diagnosis not present

## 2019-08-23 DIAGNOSIS — I4891 Unspecified atrial fibrillation: Secondary | ICD-10-CM | POA: Diagnosis not present

## 2019-08-23 DIAGNOSIS — J45998 Other asthma: Secondary | ICD-10-CM | POA: Diagnosis not present

## 2019-08-23 DIAGNOSIS — R2689 Other abnormalities of gait and mobility: Secondary | ICD-10-CM | POA: Diagnosis not present

## 2019-08-24 DIAGNOSIS — J45998 Other asthma: Secondary | ICD-10-CM | POA: Diagnosis not present

## 2019-08-24 DIAGNOSIS — R278 Other lack of coordination: Secondary | ICD-10-CM | POA: Diagnosis not present

## 2019-08-24 DIAGNOSIS — Z9181 History of falling: Secondary | ICD-10-CM | POA: Diagnosis not present

## 2019-08-24 DIAGNOSIS — R2689 Other abnormalities of gait and mobility: Secondary | ICD-10-CM | POA: Diagnosis not present

## 2019-08-24 DIAGNOSIS — R293 Abnormal posture: Secondary | ICD-10-CM | POA: Diagnosis not present

## 2019-08-24 DIAGNOSIS — M6281 Muscle weakness (generalized): Secondary | ICD-10-CM | POA: Diagnosis not present

## 2019-08-28 DIAGNOSIS — R278 Other lack of coordination: Secondary | ICD-10-CM | POA: Diagnosis not present

## 2019-08-28 DIAGNOSIS — R293 Abnormal posture: Secondary | ICD-10-CM | POA: Diagnosis not present

## 2019-08-28 DIAGNOSIS — R2689 Other abnormalities of gait and mobility: Secondary | ICD-10-CM | POA: Diagnosis not present

## 2019-08-28 DIAGNOSIS — M6281 Muscle weakness (generalized): Secondary | ICD-10-CM | POA: Diagnosis not present

## 2019-08-28 DIAGNOSIS — J45998 Other asthma: Secondary | ICD-10-CM | POA: Diagnosis not present

## 2019-08-28 DIAGNOSIS — Z9181 History of falling: Secondary | ICD-10-CM | POA: Diagnosis not present

## 2019-08-31 DIAGNOSIS — M6281 Muscle weakness (generalized): Secondary | ICD-10-CM | POA: Diagnosis not present

## 2019-08-31 DIAGNOSIS — J45998 Other asthma: Secondary | ICD-10-CM | POA: Diagnosis not present

## 2019-08-31 DIAGNOSIS — R278 Other lack of coordination: Secondary | ICD-10-CM | POA: Diagnosis not present

## 2019-08-31 DIAGNOSIS — R2689 Other abnormalities of gait and mobility: Secondary | ICD-10-CM | POA: Diagnosis not present

## 2019-08-31 DIAGNOSIS — Z9181 History of falling: Secondary | ICD-10-CM | POA: Diagnosis not present

## 2019-08-31 DIAGNOSIS — R293 Abnormal posture: Secondary | ICD-10-CM | POA: Diagnosis not present

## 2019-09-01 DIAGNOSIS — R293 Abnormal posture: Secondary | ICD-10-CM | POA: Diagnosis not present

## 2019-09-01 DIAGNOSIS — J45998 Other asthma: Secondary | ICD-10-CM | POA: Diagnosis not present

## 2019-09-01 DIAGNOSIS — R2689 Other abnormalities of gait and mobility: Secondary | ICD-10-CM | POA: Diagnosis not present

## 2019-09-01 DIAGNOSIS — M6281 Muscle weakness (generalized): Secondary | ICD-10-CM | POA: Diagnosis not present

## 2019-09-01 DIAGNOSIS — Z9181 History of falling: Secondary | ICD-10-CM | POA: Diagnosis not present

## 2019-09-01 DIAGNOSIS — R278 Other lack of coordination: Secondary | ICD-10-CM | POA: Diagnosis not present

## 2019-09-04 DIAGNOSIS — M6281 Muscle weakness (generalized): Secondary | ICD-10-CM | POA: Diagnosis not present

## 2019-09-04 DIAGNOSIS — R2689 Other abnormalities of gait and mobility: Secondary | ICD-10-CM | POA: Diagnosis not present

## 2019-09-04 DIAGNOSIS — R293 Abnormal posture: Secondary | ICD-10-CM | POA: Diagnosis not present

## 2019-09-04 DIAGNOSIS — Z9181 History of falling: Secondary | ICD-10-CM | POA: Diagnosis not present

## 2019-09-04 DIAGNOSIS — J45998 Other asthma: Secondary | ICD-10-CM | POA: Diagnosis not present

## 2019-09-04 DIAGNOSIS — R278 Other lack of coordination: Secondary | ICD-10-CM | POA: Diagnosis not present

## 2019-09-05 DIAGNOSIS — R293 Abnormal posture: Secondary | ICD-10-CM | POA: Diagnosis not present

## 2019-09-05 DIAGNOSIS — Z9181 History of falling: Secondary | ICD-10-CM | POA: Diagnosis not present

## 2019-09-05 DIAGNOSIS — R278 Other lack of coordination: Secondary | ICD-10-CM | POA: Diagnosis not present

## 2019-09-05 DIAGNOSIS — R2689 Other abnormalities of gait and mobility: Secondary | ICD-10-CM | POA: Diagnosis not present

## 2019-09-05 DIAGNOSIS — J45998 Other asthma: Secondary | ICD-10-CM | POA: Diagnosis not present

## 2019-09-05 DIAGNOSIS — M6281 Muscle weakness (generalized): Secondary | ICD-10-CM | POA: Diagnosis not present

## 2019-09-06 DIAGNOSIS — R278 Other lack of coordination: Secondary | ICD-10-CM | POA: Diagnosis not present

## 2019-09-06 DIAGNOSIS — Z9181 History of falling: Secondary | ICD-10-CM | POA: Diagnosis not present

## 2019-09-06 DIAGNOSIS — R293 Abnormal posture: Secondary | ICD-10-CM | POA: Diagnosis not present

## 2019-09-06 DIAGNOSIS — M6281 Muscle weakness (generalized): Secondary | ICD-10-CM | POA: Diagnosis not present

## 2019-09-06 DIAGNOSIS — J45998 Other asthma: Secondary | ICD-10-CM | POA: Diagnosis not present

## 2019-09-06 DIAGNOSIS — R2689 Other abnormalities of gait and mobility: Secondary | ICD-10-CM | POA: Diagnosis not present

## 2019-09-07 DIAGNOSIS — M6281 Muscle weakness (generalized): Secondary | ICD-10-CM | POA: Diagnosis not present

## 2019-09-07 DIAGNOSIS — R2689 Other abnormalities of gait and mobility: Secondary | ICD-10-CM | POA: Diagnosis not present

## 2019-09-07 DIAGNOSIS — Z9181 History of falling: Secondary | ICD-10-CM | POA: Diagnosis not present

## 2019-09-07 DIAGNOSIS — R278 Other lack of coordination: Secondary | ICD-10-CM | POA: Diagnosis not present

## 2019-09-07 DIAGNOSIS — J45998 Other asthma: Secondary | ICD-10-CM | POA: Diagnosis not present

## 2019-09-07 DIAGNOSIS — R293 Abnormal posture: Secondary | ICD-10-CM | POA: Diagnosis not present

## 2019-09-08 DIAGNOSIS — Z23 Encounter for immunization: Secondary | ICD-10-CM | POA: Diagnosis not present

## 2019-09-08 DIAGNOSIS — Z9181 History of falling: Secondary | ICD-10-CM | POA: Diagnosis not present

## 2019-09-08 DIAGNOSIS — M6281 Muscle weakness (generalized): Secondary | ICD-10-CM | POA: Diagnosis not present

## 2019-09-08 DIAGNOSIS — R293 Abnormal posture: Secondary | ICD-10-CM | POA: Diagnosis not present

## 2019-09-08 DIAGNOSIS — R2689 Other abnormalities of gait and mobility: Secondary | ICD-10-CM | POA: Diagnosis not present

## 2019-09-08 DIAGNOSIS — J45998 Other asthma: Secondary | ICD-10-CM | POA: Diagnosis not present

## 2019-09-08 DIAGNOSIS — R278 Other lack of coordination: Secondary | ICD-10-CM | POA: Diagnosis not present

## 2019-09-11 DIAGNOSIS — M6281 Muscle weakness (generalized): Secondary | ICD-10-CM | POA: Diagnosis not present

## 2019-09-11 DIAGNOSIS — R293 Abnormal posture: Secondary | ICD-10-CM | POA: Diagnosis not present

## 2019-09-11 DIAGNOSIS — R2689 Other abnormalities of gait and mobility: Secondary | ICD-10-CM | POA: Diagnosis not present

## 2019-09-11 DIAGNOSIS — J45998 Other asthma: Secondary | ICD-10-CM | POA: Diagnosis not present

## 2019-09-11 DIAGNOSIS — R278 Other lack of coordination: Secondary | ICD-10-CM | POA: Diagnosis not present

## 2019-09-11 DIAGNOSIS — Z9181 History of falling: Secondary | ICD-10-CM | POA: Diagnosis not present

## 2019-09-12 DIAGNOSIS — Z9181 History of falling: Secondary | ICD-10-CM | POA: Diagnosis not present

## 2019-09-12 DIAGNOSIS — M6281 Muscle weakness (generalized): Secondary | ICD-10-CM | POA: Diagnosis not present

## 2019-09-12 DIAGNOSIS — R2689 Other abnormalities of gait and mobility: Secondary | ICD-10-CM | POA: Diagnosis not present

## 2019-09-12 DIAGNOSIS — J45998 Other asthma: Secondary | ICD-10-CM | POA: Diagnosis not present

## 2019-09-12 DIAGNOSIS — R278 Other lack of coordination: Secondary | ICD-10-CM | POA: Diagnosis not present

## 2019-09-12 DIAGNOSIS — R293 Abnormal posture: Secondary | ICD-10-CM | POA: Diagnosis not present

## 2019-09-13 DIAGNOSIS — M6281 Muscle weakness (generalized): Secondary | ICD-10-CM | POA: Diagnosis not present

## 2019-09-13 DIAGNOSIS — Z9181 History of falling: Secondary | ICD-10-CM | POA: Diagnosis not present

## 2019-09-13 DIAGNOSIS — J45998 Other asthma: Secondary | ICD-10-CM | POA: Diagnosis not present

## 2019-09-13 DIAGNOSIS — R4689 Other symptoms and signs involving appearance and behavior: Secondary | ICD-10-CM | POA: Diagnosis not present

## 2019-09-13 DIAGNOSIS — R410 Disorientation, unspecified: Secondary | ICD-10-CM | POA: Diagnosis not present

## 2019-09-13 DIAGNOSIS — R2689 Other abnormalities of gait and mobility: Secondary | ICD-10-CM | POA: Diagnosis not present

## 2019-09-13 DIAGNOSIS — R278 Other lack of coordination: Secondary | ICD-10-CM | POA: Diagnosis not present

## 2019-09-13 DIAGNOSIS — R293 Abnormal posture: Secondary | ICD-10-CM | POA: Diagnosis not present

## 2019-09-15 DIAGNOSIS — R293 Abnormal posture: Secondary | ICD-10-CM | POA: Diagnosis not present

## 2019-09-15 DIAGNOSIS — Z9181 History of falling: Secondary | ICD-10-CM | POA: Diagnosis not present

## 2019-09-15 DIAGNOSIS — J45998 Other asthma: Secondary | ICD-10-CM | POA: Diagnosis not present

## 2019-09-15 DIAGNOSIS — M6281 Muscle weakness (generalized): Secondary | ICD-10-CM | POA: Diagnosis not present

## 2019-09-15 DIAGNOSIS — R2689 Other abnormalities of gait and mobility: Secondary | ICD-10-CM | POA: Diagnosis not present

## 2019-09-15 DIAGNOSIS — R278 Other lack of coordination: Secondary | ICD-10-CM | POA: Diagnosis not present

## 2019-09-18 DIAGNOSIS — R2689 Other abnormalities of gait and mobility: Secondary | ICD-10-CM | POA: Diagnosis not present

## 2019-09-18 DIAGNOSIS — R293 Abnormal posture: Secondary | ICD-10-CM | POA: Diagnosis not present

## 2019-09-18 DIAGNOSIS — M6281 Muscle weakness (generalized): Secondary | ICD-10-CM | POA: Diagnosis not present

## 2019-09-18 DIAGNOSIS — R278 Other lack of coordination: Secondary | ICD-10-CM | POA: Diagnosis not present

## 2019-09-18 DIAGNOSIS — J45998 Other asthma: Secondary | ICD-10-CM | POA: Diagnosis not present

## 2019-09-18 DIAGNOSIS — Z9181 History of falling: Secondary | ICD-10-CM | POA: Diagnosis not present

## 2019-09-26 DIAGNOSIS — F419 Anxiety disorder, unspecified: Secondary | ICD-10-CM | POA: Diagnosis not present

## 2019-09-26 DIAGNOSIS — I4891 Unspecified atrial fibrillation: Secondary | ICD-10-CM | POA: Diagnosis not present

## 2019-09-26 DIAGNOSIS — J45909 Unspecified asthma, uncomplicated: Secondary | ICD-10-CM | POA: Diagnosis not present

## 2019-09-26 DIAGNOSIS — E785 Hyperlipidemia, unspecified: Secondary | ICD-10-CM | POA: Diagnosis not present

## 2019-10-10 DIAGNOSIS — S42301A Unspecified fracture of shaft of humerus, right arm, initial encounter for closed fracture: Secondary | ICD-10-CM | POA: Diagnosis not present

## 2019-10-10 DIAGNOSIS — J449 Chronic obstructive pulmonary disease, unspecified: Secondary | ICD-10-CM | POA: Diagnosis not present

## 2019-10-10 DIAGNOSIS — J9601 Acute respiratory failure with hypoxia: Secondary | ICD-10-CM | POA: Diagnosis not present

## 2019-10-10 DIAGNOSIS — F419 Anxiety disorder, unspecified: Secondary | ICD-10-CM | POA: Diagnosis not present

## 2019-10-10 DIAGNOSIS — I4891 Unspecified atrial fibrillation: Secondary | ICD-10-CM | POA: Diagnosis not present

## 2019-10-10 DIAGNOSIS — F0390 Unspecified dementia without behavioral disturbance: Secondary | ICD-10-CM | POA: Diagnosis not present

## 2019-10-10 DIAGNOSIS — Z803 Family history of malignant neoplasm of breast: Secondary | ICD-10-CM | POA: Diagnosis not present

## 2019-10-10 DIAGNOSIS — I10 Essential (primary) hypertension: Secondary | ICD-10-CM | POA: Diagnosis not present

## 2019-10-11 DIAGNOSIS — Z803 Family history of malignant neoplasm of breast: Secondary | ICD-10-CM | POA: Diagnosis not present

## 2019-10-11 DIAGNOSIS — I10 Essential (primary) hypertension: Secondary | ICD-10-CM | POA: Diagnosis not present

## 2019-10-11 DIAGNOSIS — F0390 Unspecified dementia without behavioral disturbance: Secondary | ICD-10-CM | POA: Diagnosis not present

## 2019-10-11 DIAGNOSIS — J9601 Acute respiratory failure with hypoxia: Secondary | ICD-10-CM | POA: Diagnosis not present

## 2019-10-11 DIAGNOSIS — I4891 Unspecified atrial fibrillation: Secondary | ICD-10-CM | POA: Diagnosis not present

## 2019-10-11 DIAGNOSIS — J449 Chronic obstructive pulmonary disease, unspecified: Secondary | ICD-10-CM | POA: Diagnosis not present

## 2019-10-12 DIAGNOSIS — J9601 Acute respiratory failure with hypoxia: Secondary | ICD-10-CM | POA: Diagnosis not present

## 2019-10-12 DIAGNOSIS — Z803 Family history of malignant neoplasm of breast: Secondary | ICD-10-CM | POA: Diagnosis not present

## 2019-10-12 DIAGNOSIS — I4891 Unspecified atrial fibrillation: Secondary | ICD-10-CM | POA: Diagnosis not present

## 2019-10-12 DIAGNOSIS — I10 Essential (primary) hypertension: Secondary | ICD-10-CM | POA: Diagnosis not present

## 2019-10-12 DIAGNOSIS — J449 Chronic obstructive pulmonary disease, unspecified: Secondary | ICD-10-CM | POA: Diagnosis not present

## 2019-10-12 DIAGNOSIS — F0390 Unspecified dementia without behavioral disturbance: Secondary | ICD-10-CM | POA: Diagnosis not present

## 2019-10-15 DIAGNOSIS — I4891 Unspecified atrial fibrillation: Secondary | ICD-10-CM | POA: Diagnosis not present

## 2019-10-15 DIAGNOSIS — I10 Essential (primary) hypertension: Secondary | ICD-10-CM | POA: Diagnosis not present

## 2019-10-15 DIAGNOSIS — F0390 Unspecified dementia without behavioral disturbance: Secondary | ICD-10-CM | POA: Diagnosis not present

## 2019-10-15 DIAGNOSIS — J9601 Acute respiratory failure with hypoxia: Secondary | ICD-10-CM | POA: Diagnosis not present

## 2019-10-15 DIAGNOSIS — J449 Chronic obstructive pulmonary disease, unspecified: Secondary | ICD-10-CM | POA: Diagnosis not present

## 2019-10-15 DIAGNOSIS — Z803 Family history of malignant neoplasm of breast: Secondary | ICD-10-CM | POA: Diagnosis not present

## 2019-10-16 DIAGNOSIS — J9601 Acute respiratory failure with hypoxia: Secondary | ICD-10-CM | POA: Diagnosis not present

## 2019-10-16 DIAGNOSIS — F0390 Unspecified dementia without behavioral disturbance: Secondary | ICD-10-CM | POA: Diagnosis not present

## 2019-10-16 DIAGNOSIS — J449 Chronic obstructive pulmonary disease, unspecified: Secondary | ICD-10-CM | POA: Diagnosis not present

## 2019-10-16 DIAGNOSIS — I4891 Unspecified atrial fibrillation: Secondary | ICD-10-CM | POA: Diagnosis not present

## 2019-10-16 DIAGNOSIS — S42201A Unspecified fracture of upper end of right humerus, initial encounter for closed fracture: Secondary | ICD-10-CM | POA: Diagnosis not present

## 2019-10-16 DIAGNOSIS — I10 Essential (primary) hypertension: Secondary | ICD-10-CM | POA: Diagnosis not present

## 2019-10-16 DIAGNOSIS — Z803 Family history of malignant neoplasm of breast: Secondary | ICD-10-CM | POA: Diagnosis not present

## 2019-10-17 DIAGNOSIS — I4891 Unspecified atrial fibrillation: Secondary | ICD-10-CM | POA: Diagnosis not present

## 2019-10-17 DIAGNOSIS — J449 Chronic obstructive pulmonary disease, unspecified: Secondary | ICD-10-CM | POA: Diagnosis not present

## 2019-10-17 DIAGNOSIS — F0390 Unspecified dementia without behavioral disturbance: Secondary | ICD-10-CM | POA: Diagnosis not present

## 2019-10-17 DIAGNOSIS — I10 Essential (primary) hypertension: Secondary | ICD-10-CM | POA: Diagnosis not present

## 2019-10-17 DIAGNOSIS — Z803 Family history of malignant neoplasm of breast: Secondary | ICD-10-CM | POA: Diagnosis not present

## 2019-10-17 DIAGNOSIS — J9601 Acute respiratory failure with hypoxia: Secondary | ICD-10-CM | POA: Diagnosis not present

## 2019-10-18 DIAGNOSIS — F0390 Unspecified dementia without behavioral disturbance: Secondary | ICD-10-CM | POA: Diagnosis not present

## 2019-10-18 DIAGNOSIS — J449 Chronic obstructive pulmonary disease, unspecified: Secondary | ICD-10-CM | POA: Diagnosis not present

## 2019-10-18 DIAGNOSIS — J9601 Acute respiratory failure with hypoxia: Secondary | ICD-10-CM | POA: Diagnosis not present

## 2019-10-18 DIAGNOSIS — I10 Essential (primary) hypertension: Secondary | ICD-10-CM | POA: Diagnosis not present

## 2019-10-18 DIAGNOSIS — I4891 Unspecified atrial fibrillation: Secondary | ICD-10-CM | POA: Diagnosis not present

## 2019-10-18 DIAGNOSIS — Z803 Family history of malignant neoplasm of breast: Secondary | ICD-10-CM | POA: Diagnosis not present

## 2019-10-19 DIAGNOSIS — F0390 Unspecified dementia without behavioral disturbance: Secondary | ICD-10-CM | POA: Diagnosis not present

## 2019-10-19 DIAGNOSIS — Z803 Family history of malignant neoplasm of breast: Secondary | ICD-10-CM | POA: Diagnosis not present

## 2019-10-19 DIAGNOSIS — J9601 Acute respiratory failure with hypoxia: Secondary | ICD-10-CM | POA: Diagnosis not present

## 2019-10-19 DIAGNOSIS — J449 Chronic obstructive pulmonary disease, unspecified: Secondary | ICD-10-CM | POA: Diagnosis not present

## 2019-10-19 DIAGNOSIS — I10 Essential (primary) hypertension: Secondary | ICD-10-CM | POA: Diagnosis not present

## 2019-10-19 DIAGNOSIS — I4891 Unspecified atrial fibrillation: Secondary | ICD-10-CM | POA: Diagnosis not present

## 2019-10-20 DIAGNOSIS — F0390 Unspecified dementia without behavioral disturbance: Secondary | ICD-10-CM | POA: Diagnosis not present

## 2019-10-20 DIAGNOSIS — J9601 Acute respiratory failure with hypoxia: Secondary | ICD-10-CM | POA: Diagnosis not present

## 2019-10-20 DIAGNOSIS — I4891 Unspecified atrial fibrillation: Secondary | ICD-10-CM | POA: Diagnosis not present

## 2019-10-20 DIAGNOSIS — F419 Anxiety disorder, unspecified: Secondary | ICD-10-CM | POA: Diagnosis not present

## 2019-10-20 DIAGNOSIS — J449 Chronic obstructive pulmonary disease, unspecified: Secondary | ICD-10-CM | POA: Diagnosis not present

## 2019-10-20 DIAGNOSIS — I10 Essential (primary) hypertension: Secondary | ICD-10-CM | POA: Diagnosis not present

## 2019-10-20 DIAGNOSIS — S42301A Unspecified fracture of shaft of humerus, right arm, initial encounter for closed fracture: Secondary | ICD-10-CM | POA: Diagnosis not present

## 2019-10-20 DIAGNOSIS — Z803 Family history of malignant neoplasm of breast: Secondary | ICD-10-CM | POA: Diagnosis not present

## 2019-10-23 DIAGNOSIS — I10 Essential (primary) hypertension: Secondary | ICD-10-CM | POA: Diagnosis not present

## 2019-10-23 DIAGNOSIS — J9601 Acute respiratory failure with hypoxia: Secondary | ICD-10-CM | POA: Diagnosis not present

## 2019-10-23 DIAGNOSIS — Z803 Family history of malignant neoplasm of breast: Secondary | ICD-10-CM | POA: Diagnosis not present

## 2019-10-23 DIAGNOSIS — J449 Chronic obstructive pulmonary disease, unspecified: Secondary | ICD-10-CM | POA: Diagnosis not present

## 2019-10-23 DIAGNOSIS — I4891 Unspecified atrial fibrillation: Secondary | ICD-10-CM | POA: Diagnosis not present

## 2019-10-23 DIAGNOSIS — F0390 Unspecified dementia without behavioral disturbance: Secondary | ICD-10-CM | POA: Diagnosis not present

## 2019-10-24 DIAGNOSIS — J9601 Acute respiratory failure with hypoxia: Secondary | ICD-10-CM | POA: Diagnosis not present

## 2019-10-24 DIAGNOSIS — J449 Chronic obstructive pulmonary disease, unspecified: Secondary | ICD-10-CM | POA: Diagnosis not present

## 2019-10-24 DIAGNOSIS — Z803 Family history of malignant neoplasm of breast: Secondary | ICD-10-CM | POA: Diagnosis not present

## 2019-10-24 DIAGNOSIS — I4891 Unspecified atrial fibrillation: Secondary | ICD-10-CM | POA: Diagnosis not present

## 2019-10-24 DIAGNOSIS — I10 Essential (primary) hypertension: Secondary | ICD-10-CM | POA: Diagnosis not present

## 2019-10-24 DIAGNOSIS — F0390 Unspecified dementia without behavioral disturbance: Secondary | ICD-10-CM | POA: Diagnosis not present

## 2019-10-25 DIAGNOSIS — F0390 Unspecified dementia without behavioral disturbance: Secondary | ICD-10-CM | POA: Diagnosis not present

## 2019-10-25 DIAGNOSIS — Z803 Family history of malignant neoplasm of breast: Secondary | ICD-10-CM | POA: Diagnosis not present

## 2019-10-25 DIAGNOSIS — I4891 Unspecified atrial fibrillation: Secondary | ICD-10-CM | POA: Diagnosis not present

## 2019-10-25 DIAGNOSIS — J449 Chronic obstructive pulmonary disease, unspecified: Secondary | ICD-10-CM | POA: Diagnosis not present

## 2019-10-25 DIAGNOSIS — I10 Essential (primary) hypertension: Secondary | ICD-10-CM | POA: Diagnosis not present

## 2019-10-25 DIAGNOSIS — J9601 Acute respiratory failure with hypoxia: Secondary | ICD-10-CM | POA: Diagnosis not present

## 2019-10-26 DIAGNOSIS — Z803 Family history of malignant neoplasm of breast: Secondary | ICD-10-CM | POA: Diagnosis not present

## 2019-10-26 DIAGNOSIS — I4891 Unspecified atrial fibrillation: Secondary | ICD-10-CM | POA: Diagnosis not present

## 2019-10-26 DIAGNOSIS — J449 Chronic obstructive pulmonary disease, unspecified: Secondary | ICD-10-CM | POA: Diagnosis not present

## 2019-10-26 DIAGNOSIS — F0390 Unspecified dementia without behavioral disturbance: Secondary | ICD-10-CM | POA: Diagnosis not present

## 2019-10-26 DIAGNOSIS — J9601 Acute respiratory failure with hypoxia: Secondary | ICD-10-CM | POA: Diagnosis not present

## 2019-10-26 DIAGNOSIS — I10 Essential (primary) hypertension: Secondary | ICD-10-CM | POA: Diagnosis not present

## 2019-10-27 DIAGNOSIS — J9601 Acute respiratory failure with hypoxia: Secondary | ICD-10-CM | POA: Diagnosis not present

## 2019-10-27 DIAGNOSIS — F0390 Unspecified dementia without behavioral disturbance: Secondary | ICD-10-CM | POA: Diagnosis not present

## 2019-10-27 DIAGNOSIS — Z803 Family history of malignant neoplasm of breast: Secondary | ICD-10-CM | POA: Diagnosis not present

## 2019-10-27 DIAGNOSIS — J449 Chronic obstructive pulmonary disease, unspecified: Secondary | ICD-10-CM | POA: Diagnosis not present

## 2019-10-27 DIAGNOSIS — I4891 Unspecified atrial fibrillation: Secondary | ICD-10-CM | POA: Diagnosis not present

## 2019-10-27 DIAGNOSIS — I10 Essential (primary) hypertension: Secondary | ICD-10-CM | POA: Diagnosis not present

## 2019-10-30 DIAGNOSIS — I4891 Unspecified atrial fibrillation: Secondary | ICD-10-CM | POA: Diagnosis not present

## 2019-10-30 DIAGNOSIS — Z803 Family history of malignant neoplasm of breast: Secondary | ICD-10-CM | POA: Diagnosis not present

## 2019-10-30 DIAGNOSIS — F0390 Unspecified dementia without behavioral disturbance: Secondary | ICD-10-CM | POA: Diagnosis not present

## 2019-10-30 DIAGNOSIS — J9601 Acute respiratory failure with hypoxia: Secondary | ICD-10-CM | POA: Diagnosis not present

## 2019-10-30 DIAGNOSIS — I10 Essential (primary) hypertension: Secondary | ICD-10-CM | POA: Diagnosis not present

## 2019-10-30 DIAGNOSIS — J449 Chronic obstructive pulmonary disease, unspecified: Secondary | ICD-10-CM | POA: Diagnosis not present

## 2019-10-31 DIAGNOSIS — J449 Chronic obstructive pulmonary disease, unspecified: Secondary | ICD-10-CM | POA: Diagnosis not present

## 2019-10-31 DIAGNOSIS — Z803 Family history of malignant neoplasm of breast: Secondary | ICD-10-CM | POA: Diagnosis not present

## 2019-10-31 DIAGNOSIS — I10 Essential (primary) hypertension: Secondary | ICD-10-CM | POA: Diagnosis not present

## 2019-10-31 DIAGNOSIS — I4891 Unspecified atrial fibrillation: Secondary | ICD-10-CM | POA: Diagnosis not present

## 2019-10-31 DIAGNOSIS — F0390 Unspecified dementia without behavioral disturbance: Secondary | ICD-10-CM | POA: Diagnosis not present

## 2019-10-31 DIAGNOSIS — J9601 Acute respiratory failure with hypoxia: Secondary | ICD-10-CM | POA: Diagnosis not present

## 2019-10-31 DIAGNOSIS — S42301A Unspecified fracture of shaft of humerus, right arm, initial encounter for closed fracture: Secondary | ICD-10-CM | POA: Diagnosis not present

## 2019-11-01 DIAGNOSIS — I4891 Unspecified atrial fibrillation: Secondary | ICD-10-CM | POA: Diagnosis not present

## 2019-11-01 DIAGNOSIS — F0390 Unspecified dementia without behavioral disturbance: Secondary | ICD-10-CM | POA: Diagnosis not present

## 2019-11-01 DIAGNOSIS — Z803 Family history of malignant neoplasm of breast: Secondary | ICD-10-CM | POA: Diagnosis not present

## 2019-11-01 DIAGNOSIS — J449 Chronic obstructive pulmonary disease, unspecified: Secondary | ICD-10-CM | POA: Diagnosis not present

## 2019-11-01 DIAGNOSIS — I10 Essential (primary) hypertension: Secondary | ICD-10-CM | POA: Diagnosis not present

## 2019-11-01 DIAGNOSIS — J9601 Acute respiratory failure with hypoxia: Secondary | ICD-10-CM | POA: Diagnosis not present

## 2019-11-02 DIAGNOSIS — I4891 Unspecified atrial fibrillation: Secondary | ICD-10-CM | POA: Diagnosis not present

## 2019-11-02 DIAGNOSIS — F0390 Unspecified dementia without behavioral disturbance: Secondary | ICD-10-CM | POA: Diagnosis not present

## 2019-11-02 DIAGNOSIS — Z803 Family history of malignant neoplasm of breast: Secondary | ICD-10-CM | POA: Diagnosis not present

## 2019-11-02 DIAGNOSIS — I10 Essential (primary) hypertension: Secondary | ICD-10-CM | POA: Diagnosis not present

## 2019-11-02 DIAGNOSIS — J9601 Acute respiratory failure with hypoxia: Secondary | ICD-10-CM | POA: Diagnosis not present

## 2019-11-02 DIAGNOSIS — J449 Chronic obstructive pulmonary disease, unspecified: Secondary | ICD-10-CM | POA: Diagnosis not present

## 2019-11-06 DIAGNOSIS — J9601 Acute respiratory failure with hypoxia: Secondary | ICD-10-CM | POA: Diagnosis not present

## 2019-11-06 DIAGNOSIS — F0390 Unspecified dementia without behavioral disturbance: Secondary | ICD-10-CM | POA: Diagnosis not present

## 2019-11-06 DIAGNOSIS — I4891 Unspecified atrial fibrillation: Secondary | ICD-10-CM | POA: Diagnosis not present

## 2019-11-06 DIAGNOSIS — I10 Essential (primary) hypertension: Secondary | ICD-10-CM | POA: Diagnosis not present

## 2019-11-06 DIAGNOSIS — Z803 Family history of malignant neoplasm of breast: Secondary | ICD-10-CM | POA: Diagnosis not present

## 2019-11-06 DIAGNOSIS — J449 Chronic obstructive pulmonary disease, unspecified: Secondary | ICD-10-CM | POA: Diagnosis not present

## 2019-11-07 DIAGNOSIS — I4891 Unspecified atrial fibrillation: Secondary | ICD-10-CM | POA: Diagnosis not present

## 2019-11-07 DIAGNOSIS — F0390 Unspecified dementia without behavioral disturbance: Secondary | ICD-10-CM | POA: Diagnosis not present

## 2019-11-07 DIAGNOSIS — J9601 Acute respiratory failure with hypoxia: Secondary | ICD-10-CM | POA: Diagnosis not present

## 2019-11-07 DIAGNOSIS — J449 Chronic obstructive pulmonary disease, unspecified: Secondary | ICD-10-CM | POA: Diagnosis not present

## 2019-11-07 DIAGNOSIS — Z803 Family history of malignant neoplasm of breast: Secondary | ICD-10-CM | POA: Diagnosis not present

## 2019-11-07 DIAGNOSIS — I10 Essential (primary) hypertension: Secondary | ICD-10-CM | POA: Diagnosis not present

## 2019-11-10 DIAGNOSIS — Z803 Family history of malignant neoplasm of breast: Secondary | ICD-10-CM | POA: Diagnosis not present

## 2019-11-10 DIAGNOSIS — J449 Chronic obstructive pulmonary disease, unspecified: Secondary | ICD-10-CM | POA: Diagnosis not present

## 2019-11-10 DIAGNOSIS — I4891 Unspecified atrial fibrillation: Secondary | ICD-10-CM | POA: Diagnosis not present

## 2019-11-10 DIAGNOSIS — I10 Essential (primary) hypertension: Secondary | ICD-10-CM | POA: Diagnosis not present

## 2019-11-10 DIAGNOSIS — J9601 Acute respiratory failure with hypoxia: Secondary | ICD-10-CM | POA: Diagnosis not present

## 2019-11-10 DIAGNOSIS — F0390 Unspecified dementia without behavioral disturbance: Secondary | ICD-10-CM | POA: Diagnosis not present

## 2019-11-11 DIAGNOSIS — F0390 Unspecified dementia without behavioral disturbance: Secondary | ICD-10-CM | POA: Diagnosis not present

## 2019-11-11 DIAGNOSIS — I10 Essential (primary) hypertension: Secondary | ICD-10-CM | POA: Diagnosis not present

## 2019-11-11 DIAGNOSIS — Z803 Family history of malignant neoplasm of breast: Secondary | ICD-10-CM | POA: Diagnosis not present

## 2019-11-11 DIAGNOSIS — J9601 Acute respiratory failure with hypoxia: Secondary | ICD-10-CM | POA: Diagnosis not present

## 2019-11-11 DIAGNOSIS — J449 Chronic obstructive pulmonary disease, unspecified: Secondary | ICD-10-CM | POA: Diagnosis not present

## 2019-11-11 DIAGNOSIS — I4891 Unspecified atrial fibrillation: Secondary | ICD-10-CM | POA: Diagnosis not present

## 2019-11-13 DIAGNOSIS — F0390 Unspecified dementia without behavioral disturbance: Secondary | ICD-10-CM | POA: Diagnosis not present

## 2019-11-13 DIAGNOSIS — I4891 Unspecified atrial fibrillation: Secondary | ICD-10-CM | POA: Diagnosis not present

## 2019-11-13 DIAGNOSIS — J449 Chronic obstructive pulmonary disease, unspecified: Secondary | ICD-10-CM | POA: Diagnosis not present

## 2019-11-13 DIAGNOSIS — I10 Essential (primary) hypertension: Secondary | ICD-10-CM | POA: Diagnosis not present

## 2019-11-13 DIAGNOSIS — Z803 Family history of malignant neoplasm of breast: Secondary | ICD-10-CM | POA: Diagnosis not present

## 2019-11-13 DIAGNOSIS — J9601 Acute respiratory failure with hypoxia: Secondary | ICD-10-CM | POA: Diagnosis not present

## 2019-11-14 DIAGNOSIS — J449 Chronic obstructive pulmonary disease, unspecified: Secondary | ICD-10-CM | POA: Diagnosis not present

## 2019-11-14 DIAGNOSIS — I4891 Unspecified atrial fibrillation: Secondary | ICD-10-CM | POA: Diagnosis not present

## 2019-11-14 DIAGNOSIS — F0390 Unspecified dementia without behavioral disturbance: Secondary | ICD-10-CM | POA: Diagnosis not present

## 2019-11-14 DIAGNOSIS — I10 Essential (primary) hypertension: Secondary | ICD-10-CM | POA: Diagnosis not present

## 2019-11-14 DIAGNOSIS — J9601 Acute respiratory failure with hypoxia: Secondary | ICD-10-CM | POA: Diagnosis not present

## 2019-11-14 DIAGNOSIS — Z803 Family history of malignant neoplasm of breast: Secondary | ICD-10-CM | POA: Diagnosis not present

## 2019-11-16 DIAGNOSIS — Z803 Family history of malignant neoplasm of breast: Secondary | ICD-10-CM | POA: Diagnosis not present

## 2019-11-16 DIAGNOSIS — I10 Essential (primary) hypertension: Secondary | ICD-10-CM | POA: Diagnosis not present

## 2019-11-16 DIAGNOSIS — J9601 Acute respiratory failure with hypoxia: Secondary | ICD-10-CM | POA: Diagnosis not present

## 2019-11-16 DIAGNOSIS — I4891 Unspecified atrial fibrillation: Secondary | ICD-10-CM | POA: Diagnosis not present

## 2019-11-16 DIAGNOSIS — F0390 Unspecified dementia without behavioral disturbance: Secondary | ICD-10-CM | POA: Diagnosis not present

## 2019-11-16 DIAGNOSIS — J449 Chronic obstructive pulmonary disease, unspecified: Secondary | ICD-10-CM | POA: Diagnosis not present

## 2019-11-17 DIAGNOSIS — J449 Chronic obstructive pulmonary disease, unspecified: Secondary | ICD-10-CM | POA: Diagnosis not present

## 2019-11-17 DIAGNOSIS — F0390 Unspecified dementia without behavioral disturbance: Secondary | ICD-10-CM | POA: Diagnosis not present

## 2019-11-17 DIAGNOSIS — I4891 Unspecified atrial fibrillation: Secondary | ICD-10-CM | POA: Diagnosis not present

## 2019-11-17 DIAGNOSIS — Z803 Family history of malignant neoplasm of breast: Secondary | ICD-10-CM | POA: Diagnosis not present

## 2019-11-17 DIAGNOSIS — J9601 Acute respiratory failure with hypoxia: Secondary | ICD-10-CM | POA: Diagnosis not present

## 2019-11-17 DIAGNOSIS — I10 Essential (primary) hypertension: Secondary | ICD-10-CM | POA: Diagnosis not present

## 2019-11-20 DIAGNOSIS — I4891 Unspecified atrial fibrillation: Secondary | ICD-10-CM | POA: Diagnosis not present

## 2019-11-20 DIAGNOSIS — I10 Essential (primary) hypertension: Secondary | ICD-10-CM | POA: Diagnosis not present

## 2019-11-20 DIAGNOSIS — Z803 Family history of malignant neoplasm of breast: Secondary | ICD-10-CM | POA: Diagnosis not present

## 2019-11-20 DIAGNOSIS — S42301A Unspecified fracture of shaft of humerus, right arm, initial encounter for closed fracture: Secondary | ICD-10-CM | POA: Diagnosis not present

## 2019-11-20 DIAGNOSIS — J449 Chronic obstructive pulmonary disease, unspecified: Secondary | ICD-10-CM | POA: Diagnosis not present

## 2019-11-20 DIAGNOSIS — F419 Anxiety disorder, unspecified: Secondary | ICD-10-CM | POA: Diagnosis not present

## 2019-11-20 DIAGNOSIS — F0390 Unspecified dementia without behavioral disturbance: Secondary | ICD-10-CM | POA: Diagnosis not present

## 2019-11-20 DIAGNOSIS — J9601 Acute respiratory failure with hypoxia: Secondary | ICD-10-CM | POA: Diagnosis not present

## 2019-11-21 DIAGNOSIS — F0390 Unspecified dementia without behavioral disturbance: Secondary | ICD-10-CM | POA: Diagnosis not present

## 2019-11-21 DIAGNOSIS — J449 Chronic obstructive pulmonary disease, unspecified: Secondary | ICD-10-CM | POA: Diagnosis not present

## 2019-11-21 DIAGNOSIS — I4891 Unspecified atrial fibrillation: Secondary | ICD-10-CM | POA: Diagnosis not present

## 2019-11-21 DIAGNOSIS — Z803 Family history of malignant neoplasm of breast: Secondary | ICD-10-CM | POA: Diagnosis not present

## 2019-11-21 DIAGNOSIS — J9601 Acute respiratory failure with hypoxia: Secondary | ICD-10-CM | POA: Diagnosis not present

## 2019-11-21 DIAGNOSIS — I10 Essential (primary) hypertension: Secondary | ICD-10-CM | POA: Diagnosis not present

## 2019-11-22 DIAGNOSIS — I4891 Unspecified atrial fibrillation: Secondary | ICD-10-CM | POA: Diagnosis not present

## 2019-11-22 DIAGNOSIS — J449 Chronic obstructive pulmonary disease, unspecified: Secondary | ICD-10-CM | POA: Diagnosis not present

## 2019-11-22 DIAGNOSIS — J9601 Acute respiratory failure with hypoxia: Secondary | ICD-10-CM | POA: Diagnosis not present

## 2019-11-22 DIAGNOSIS — Z803 Family history of malignant neoplasm of breast: Secondary | ICD-10-CM | POA: Diagnosis not present

## 2019-11-22 DIAGNOSIS — F0390 Unspecified dementia without behavioral disturbance: Secondary | ICD-10-CM | POA: Diagnosis not present

## 2019-11-22 DIAGNOSIS — I10 Essential (primary) hypertension: Secondary | ICD-10-CM | POA: Diagnosis not present

## 2019-11-23 DIAGNOSIS — J9601 Acute respiratory failure with hypoxia: Secondary | ICD-10-CM | POA: Diagnosis not present

## 2019-11-23 DIAGNOSIS — I10 Essential (primary) hypertension: Secondary | ICD-10-CM | POA: Diagnosis not present

## 2019-11-23 DIAGNOSIS — J449 Chronic obstructive pulmonary disease, unspecified: Secondary | ICD-10-CM | POA: Diagnosis not present

## 2019-11-23 DIAGNOSIS — F0390 Unspecified dementia without behavioral disturbance: Secondary | ICD-10-CM | POA: Diagnosis not present

## 2019-11-23 DIAGNOSIS — Z803 Family history of malignant neoplasm of breast: Secondary | ICD-10-CM | POA: Diagnosis not present

## 2019-11-23 DIAGNOSIS — I4891 Unspecified atrial fibrillation: Secondary | ICD-10-CM | POA: Diagnosis not present

## 2019-11-24 DIAGNOSIS — Z803 Family history of malignant neoplasm of breast: Secondary | ICD-10-CM | POA: Diagnosis not present

## 2019-11-24 DIAGNOSIS — J449 Chronic obstructive pulmonary disease, unspecified: Secondary | ICD-10-CM | POA: Diagnosis not present

## 2019-11-24 DIAGNOSIS — J9601 Acute respiratory failure with hypoxia: Secondary | ICD-10-CM | POA: Diagnosis not present

## 2019-11-24 DIAGNOSIS — I10 Essential (primary) hypertension: Secondary | ICD-10-CM | POA: Diagnosis not present

## 2019-11-24 DIAGNOSIS — F0390 Unspecified dementia without behavioral disturbance: Secondary | ICD-10-CM | POA: Diagnosis not present

## 2019-11-24 DIAGNOSIS — I4891 Unspecified atrial fibrillation: Secondary | ICD-10-CM | POA: Diagnosis not present

## 2019-11-26 DIAGNOSIS — F0390 Unspecified dementia without behavioral disturbance: Secondary | ICD-10-CM | POA: Diagnosis not present

## 2019-11-26 DIAGNOSIS — J9601 Acute respiratory failure with hypoxia: Secondary | ICD-10-CM | POA: Diagnosis not present

## 2019-11-26 DIAGNOSIS — I4891 Unspecified atrial fibrillation: Secondary | ICD-10-CM | POA: Diagnosis not present

## 2019-11-26 DIAGNOSIS — Z803 Family history of malignant neoplasm of breast: Secondary | ICD-10-CM | POA: Diagnosis not present

## 2019-11-26 DIAGNOSIS — J449 Chronic obstructive pulmonary disease, unspecified: Secondary | ICD-10-CM | POA: Diagnosis not present

## 2019-11-26 DIAGNOSIS — I10 Essential (primary) hypertension: Secondary | ICD-10-CM | POA: Diagnosis not present

## 2019-11-28 DIAGNOSIS — Z803 Family history of malignant neoplasm of breast: Secondary | ICD-10-CM | POA: Diagnosis not present

## 2019-11-28 DIAGNOSIS — J449 Chronic obstructive pulmonary disease, unspecified: Secondary | ICD-10-CM | POA: Diagnosis not present

## 2019-11-28 DIAGNOSIS — I4891 Unspecified atrial fibrillation: Secondary | ICD-10-CM | POA: Diagnosis not present

## 2019-11-28 DIAGNOSIS — I10 Essential (primary) hypertension: Secondary | ICD-10-CM | POA: Diagnosis not present

## 2019-11-28 DIAGNOSIS — F0390 Unspecified dementia without behavioral disturbance: Secondary | ICD-10-CM | POA: Diagnosis not present

## 2019-11-28 DIAGNOSIS — Z23 Encounter for immunization: Secondary | ICD-10-CM | POA: Diagnosis not present

## 2019-11-28 DIAGNOSIS — J9601 Acute respiratory failure with hypoxia: Secondary | ICD-10-CM | POA: Diagnosis not present

## 2019-11-29 DIAGNOSIS — I10 Essential (primary) hypertension: Secondary | ICD-10-CM | POA: Diagnosis not present

## 2019-11-29 DIAGNOSIS — F0390 Unspecified dementia without behavioral disturbance: Secondary | ICD-10-CM | POA: Diagnosis not present

## 2019-11-29 DIAGNOSIS — I4891 Unspecified atrial fibrillation: Secondary | ICD-10-CM | POA: Diagnosis not present

## 2019-11-29 DIAGNOSIS — J9601 Acute respiratory failure with hypoxia: Secondary | ICD-10-CM | POA: Diagnosis not present

## 2019-11-29 DIAGNOSIS — J449 Chronic obstructive pulmonary disease, unspecified: Secondary | ICD-10-CM | POA: Diagnosis not present

## 2019-11-29 DIAGNOSIS — Z803 Family history of malignant neoplasm of breast: Secondary | ICD-10-CM | POA: Diagnosis not present

## 2019-11-30 DIAGNOSIS — J449 Chronic obstructive pulmonary disease, unspecified: Secondary | ICD-10-CM | POA: Diagnosis not present

## 2019-11-30 DIAGNOSIS — F0390 Unspecified dementia without behavioral disturbance: Secondary | ICD-10-CM | POA: Diagnosis not present

## 2019-11-30 DIAGNOSIS — I4891 Unspecified atrial fibrillation: Secondary | ICD-10-CM | POA: Diagnosis not present

## 2019-11-30 DIAGNOSIS — J9601 Acute respiratory failure with hypoxia: Secondary | ICD-10-CM | POA: Diagnosis not present

## 2019-11-30 DIAGNOSIS — Z803 Family history of malignant neoplasm of breast: Secondary | ICD-10-CM | POA: Diagnosis not present

## 2019-11-30 DIAGNOSIS — I10 Essential (primary) hypertension: Secondary | ICD-10-CM | POA: Diagnosis not present

## 2019-12-05 DIAGNOSIS — R296 Repeated falls: Secondary | ICD-10-CM | POA: Diagnosis not present

## 2019-12-05 DIAGNOSIS — Z803 Family history of malignant neoplasm of breast: Secondary | ICD-10-CM | POA: Diagnosis not present

## 2019-12-05 DIAGNOSIS — I10 Essential (primary) hypertension: Secondary | ICD-10-CM | POA: Diagnosis not present

## 2019-12-05 DIAGNOSIS — I4891 Unspecified atrial fibrillation: Secondary | ICD-10-CM | POA: Diagnosis not present

## 2019-12-05 DIAGNOSIS — J9601 Acute respiratory failure with hypoxia: Secondary | ICD-10-CM | POA: Diagnosis not present

## 2019-12-05 DIAGNOSIS — J449 Chronic obstructive pulmonary disease, unspecified: Secondary | ICD-10-CM | POA: Diagnosis not present

## 2019-12-05 DIAGNOSIS — F0390 Unspecified dementia without behavioral disturbance: Secondary | ICD-10-CM | POA: Diagnosis not present

## 2019-12-05 DIAGNOSIS — M7981 Nontraumatic hematoma of soft tissue: Secondary | ICD-10-CM | POA: Diagnosis not present

## 2019-12-06 DIAGNOSIS — Z803 Family history of malignant neoplasm of breast: Secondary | ICD-10-CM | POA: Diagnosis not present

## 2019-12-06 DIAGNOSIS — I4891 Unspecified atrial fibrillation: Secondary | ICD-10-CM | POA: Diagnosis not present

## 2019-12-06 DIAGNOSIS — J449 Chronic obstructive pulmonary disease, unspecified: Secondary | ICD-10-CM | POA: Diagnosis not present

## 2019-12-06 DIAGNOSIS — J9601 Acute respiratory failure with hypoxia: Secondary | ICD-10-CM | POA: Diagnosis not present

## 2019-12-06 DIAGNOSIS — I10 Essential (primary) hypertension: Secondary | ICD-10-CM | POA: Diagnosis not present

## 2019-12-06 DIAGNOSIS — F0390 Unspecified dementia without behavioral disturbance: Secondary | ICD-10-CM | POA: Diagnosis not present

## 2019-12-07 DIAGNOSIS — I10 Essential (primary) hypertension: Secondary | ICD-10-CM | POA: Diagnosis not present

## 2019-12-07 DIAGNOSIS — Z803 Family history of malignant neoplasm of breast: Secondary | ICD-10-CM | POA: Diagnosis not present

## 2019-12-07 DIAGNOSIS — F0390 Unspecified dementia without behavioral disturbance: Secondary | ICD-10-CM | POA: Diagnosis not present

## 2019-12-07 DIAGNOSIS — J9601 Acute respiratory failure with hypoxia: Secondary | ICD-10-CM | POA: Diagnosis not present

## 2019-12-07 DIAGNOSIS — J449 Chronic obstructive pulmonary disease, unspecified: Secondary | ICD-10-CM | POA: Diagnosis not present

## 2019-12-07 DIAGNOSIS — I4891 Unspecified atrial fibrillation: Secondary | ICD-10-CM | POA: Diagnosis not present

## 2019-12-09 DIAGNOSIS — Z803 Family history of malignant neoplasm of breast: Secondary | ICD-10-CM | POA: Diagnosis not present

## 2019-12-09 DIAGNOSIS — J449 Chronic obstructive pulmonary disease, unspecified: Secondary | ICD-10-CM | POA: Diagnosis not present

## 2019-12-09 DIAGNOSIS — I10 Essential (primary) hypertension: Secondary | ICD-10-CM | POA: Diagnosis not present

## 2019-12-09 DIAGNOSIS — J9601 Acute respiratory failure with hypoxia: Secondary | ICD-10-CM | POA: Diagnosis not present

## 2019-12-09 DIAGNOSIS — I4891 Unspecified atrial fibrillation: Secondary | ICD-10-CM | POA: Diagnosis not present

## 2019-12-09 DIAGNOSIS — F0390 Unspecified dementia without behavioral disturbance: Secondary | ICD-10-CM | POA: Diagnosis not present

## 2019-12-11 DIAGNOSIS — I10 Essential (primary) hypertension: Secondary | ICD-10-CM | POA: Diagnosis not present

## 2019-12-11 DIAGNOSIS — I4891 Unspecified atrial fibrillation: Secondary | ICD-10-CM | POA: Diagnosis not present

## 2019-12-11 DIAGNOSIS — F0390 Unspecified dementia without behavioral disturbance: Secondary | ICD-10-CM | POA: Diagnosis not present

## 2019-12-11 DIAGNOSIS — W19XXXA Unspecified fall, initial encounter: Secondary | ICD-10-CM | POA: Diagnosis not present

## 2019-12-11 DIAGNOSIS — S0990XA Unspecified injury of head, initial encounter: Secondary | ICD-10-CM | POA: Diagnosis not present

## 2019-12-11 DIAGNOSIS — Z803 Family history of malignant neoplasm of breast: Secondary | ICD-10-CM | POA: Diagnosis not present

## 2019-12-11 DIAGNOSIS — J9601 Acute respiratory failure with hypoxia: Secondary | ICD-10-CM | POA: Diagnosis not present

## 2019-12-11 DIAGNOSIS — J449 Chronic obstructive pulmonary disease, unspecified: Secondary | ICD-10-CM | POA: Diagnosis not present

## 2019-12-12 DIAGNOSIS — J449 Chronic obstructive pulmonary disease, unspecified: Secondary | ICD-10-CM | POA: Diagnosis not present

## 2019-12-12 DIAGNOSIS — I4891 Unspecified atrial fibrillation: Secondary | ICD-10-CM | POA: Diagnosis not present

## 2019-12-12 DIAGNOSIS — Z803 Family history of malignant neoplasm of breast: Secondary | ICD-10-CM | POA: Diagnosis not present

## 2019-12-12 DIAGNOSIS — J9601 Acute respiratory failure with hypoxia: Secondary | ICD-10-CM | POA: Diagnosis not present

## 2019-12-12 DIAGNOSIS — F0390 Unspecified dementia without behavioral disturbance: Secondary | ICD-10-CM | POA: Diagnosis not present

## 2019-12-12 DIAGNOSIS — I10 Essential (primary) hypertension: Secondary | ICD-10-CM | POA: Diagnosis not present

## 2019-12-13 DIAGNOSIS — I4891 Unspecified atrial fibrillation: Secondary | ICD-10-CM | POA: Diagnosis not present

## 2019-12-13 DIAGNOSIS — J9601 Acute respiratory failure with hypoxia: Secondary | ICD-10-CM | POA: Diagnosis not present

## 2019-12-13 DIAGNOSIS — J449 Chronic obstructive pulmonary disease, unspecified: Secondary | ICD-10-CM | POA: Diagnosis not present

## 2019-12-13 DIAGNOSIS — Z803 Family history of malignant neoplasm of breast: Secondary | ICD-10-CM | POA: Diagnosis not present

## 2019-12-13 DIAGNOSIS — I10 Essential (primary) hypertension: Secondary | ICD-10-CM | POA: Diagnosis not present

## 2019-12-13 DIAGNOSIS — F0390 Unspecified dementia without behavioral disturbance: Secondary | ICD-10-CM | POA: Diagnosis not present

## 2019-12-14 DIAGNOSIS — J9601 Acute respiratory failure with hypoxia: Secondary | ICD-10-CM | POA: Diagnosis not present

## 2019-12-14 DIAGNOSIS — W19XXXA Unspecified fall, initial encounter: Secondary | ICD-10-CM | POA: Diagnosis not present

## 2019-12-14 DIAGNOSIS — I4891 Unspecified atrial fibrillation: Secondary | ICD-10-CM | POA: Diagnosis not present

## 2019-12-14 DIAGNOSIS — J449 Chronic obstructive pulmonary disease, unspecified: Secondary | ICD-10-CM | POA: Diagnosis not present

## 2019-12-14 DIAGNOSIS — F0390 Unspecified dementia without behavioral disturbance: Secondary | ICD-10-CM | POA: Diagnosis not present

## 2019-12-14 DIAGNOSIS — Z803 Family history of malignant neoplasm of breast: Secondary | ICD-10-CM | POA: Diagnosis not present

## 2019-12-14 DIAGNOSIS — I10 Essential (primary) hypertension: Secondary | ICD-10-CM | POA: Diagnosis not present

## 2019-12-14 DIAGNOSIS — M25559 Pain in unspecified hip: Secondary | ICD-10-CM | POA: Diagnosis not present

## 2019-12-15 DIAGNOSIS — F0390 Unspecified dementia without behavioral disturbance: Secondary | ICD-10-CM | POA: Diagnosis not present

## 2019-12-15 DIAGNOSIS — I10 Essential (primary) hypertension: Secondary | ICD-10-CM | POA: Diagnosis not present

## 2019-12-15 DIAGNOSIS — J449 Chronic obstructive pulmonary disease, unspecified: Secondary | ICD-10-CM | POA: Diagnosis not present

## 2019-12-15 DIAGNOSIS — I4891 Unspecified atrial fibrillation: Secondary | ICD-10-CM | POA: Diagnosis not present

## 2019-12-15 DIAGNOSIS — J9601 Acute respiratory failure with hypoxia: Secondary | ICD-10-CM | POA: Diagnosis not present

## 2019-12-15 DIAGNOSIS — Z803 Family history of malignant neoplasm of breast: Secondary | ICD-10-CM | POA: Diagnosis not present

## 2019-12-16 DIAGNOSIS — Z803 Family history of malignant neoplasm of breast: Secondary | ICD-10-CM | POA: Diagnosis not present

## 2019-12-16 DIAGNOSIS — J9601 Acute respiratory failure with hypoxia: Secondary | ICD-10-CM | POA: Diagnosis not present

## 2019-12-16 DIAGNOSIS — I4891 Unspecified atrial fibrillation: Secondary | ICD-10-CM | POA: Diagnosis not present

## 2019-12-16 DIAGNOSIS — I10 Essential (primary) hypertension: Secondary | ICD-10-CM | POA: Diagnosis not present

## 2019-12-16 DIAGNOSIS — J449 Chronic obstructive pulmonary disease, unspecified: Secondary | ICD-10-CM | POA: Diagnosis not present

## 2019-12-16 DIAGNOSIS — F0390 Unspecified dementia without behavioral disturbance: Secondary | ICD-10-CM | POA: Diagnosis not present

## 2019-12-17 DIAGNOSIS — I10 Essential (primary) hypertension: Secondary | ICD-10-CM | POA: Diagnosis not present

## 2019-12-17 DIAGNOSIS — F0390 Unspecified dementia without behavioral disturbance: Secondary | ICD-10-CM | POA: Diagnosis not present

## 2019-12-17 DIAGNOSIS — J449 Chronic obstructive pulmonary disease, unspecified: Secondary | ICD-10-CM | POA: Diagnosis not present

## 2019-12-17 DIAGNOSIS — I4891 Unspecified atrial fibrillation: Secondary | ICD-10-CM | POA: Diagnosis not present

## 2019-12-17 DIAGNOSIS — Z803 Family history of malignant neoplasm of breast: Secondary | ICD-10-CM | POA: Diagnosis not present

## 2019-12-17 DIAGNOSIS — J9601 Acute respiratory failure with hypoxia: Secondary | ICD-10-CM | POA: Diagnosis not present

## 2019-12-18 DIAGNOSIS — J449 Chronic obstructive pulmonary disease, unspecified: Secondary | ICD-10-CM | POA: Diagnosis not present

## 2019-12-18 DIAGNOSIS — J9601 Acute respiratory failure with hypoxia: Secondary | ICD-10-CM | POA: Diagnosis not present

## 2019-12-18 DIAGNOSIS — F0390 Unspecified dementia without behavioral disturbance: Secondary | ICD-10-CM | POA: Diagnosis not present

## 2019-12-18 DIAGNOSIS — I4891 Unspecified atrial fibrillation: Secondary | ICD-10-CM | POA: Diagnosis not present

## 2019-12-18 DIAGNOSIS — I10 Essential (primary) hypertension: Secondary | ICD-10-CM | POA: Diagnosis not present

## 2019-12-18 DIAGNOSIS — F419 Anxiety disorder, unspecified: Secondary | ICD-10-CM | POA: Diagnosis not present

## 2019-12-18 DIAGNOSIS — Z803 Family history of malignant neoplasm of breast: Secondary | ICD-10-CM | POA: Diagnosis not present

## 2019-12-18 DIAGNOSIS — S42301A Unspecified fracture of shaft of humerus, right arm, initial encounter for closed fracture: Secondary | ICD-10-CM | POA: Diagnosis not present

## 2019-12-19 DIAGNOSIS — I4891 Unspecified atrial fibrillation: Secondary | ICD-10-CM | POA: Diagnosis not present

## 2019-12-19 DIAGNOSIS — F0390 Unspecified dementia without behavioral disturbance: Secondary | ICD-10-CM | POA: Diagnosis not present

## 2019-12-19 DIAGNOSIS — I10 Essential (primary) hypertension: Secondary | ICD-10-CM | POA: Diagnosis not present

## 2019-12-19 DIAGNOSIS — Z803 Family history of malignant neoplasm of breast: Secondary | ICD-10-CM | POA: Diagnosis not present

## 2019-12-19 DIAGNOSIS — J449 Chronic obstructive pulmonary disease, unspecified: Secondary | ICD-10-CM | POA: Diagnosis not present

## 2019-12-19 DIAGNOSIS — J9601 Acute respiratory failure with hypoxia: Secondary | ICD-10-CM | POA: Diagnosis not present

## 2019-12-21 DIAGNOSIS — J9601 Acute respiratory failure with hypoxia: Secondary | ICD-10-CM | POA: Diagnosis not present

## 2019-12-21 DIAGNOSIS — I4891 Unspecified atrial fibrillation: Secondary | ICD-10-CM | POA: Diagnosis not present

## 2019-12-21 DIAGNOSIS — I10 Essential (primary) hypertension: Secondary | ICD-10-CM | POA: Diagnosis not present

## 2019-12-21 DIAGNOSIS — F0390 Unspecified dementia without behavioral disturbance: Secondary | ICD-10-CM | POA: Diagnosis not present

## 2019-12-21 DIAGNOSIS — Z803 Family history of malignant neoplasm of breast: Secondary | ICD-10-CM | POA: Diagnosis not present

## 2019-12-21 DIAGNOSIS — J449 Chronic obstructive pulmonary disease, unspecified: Secondary | ICD-10-CM | POA: Diagnosis not present

## 2019-12-22 DIAGNOSIS — I4891 Unspecified atrial fibrillation: Secondary | ICD-10-CM | POA: Diagnosis not present

## 2019-12-22 DIAGNOSIS — J9601 Acute respiratory failure with hypoxia: Secondary | ICD-10-CM | POA: Diagnosis not present

## 2019-12-22 DIAGNOSIS — Z803 Family history of malignant neoplasm of breast: Secondary | ICD-10-CM | POA: Diagnosis not present

## 2019-12-22 DIAGNOSIS — I10 Essential (primary) hypertension: Secondary | ICD-10-CM | POA: Diagnosis not present

## 2019-12-22 DIAGNOSIS — J449 Chronic obstructive pulmonary disease, unspecified: Secondary | ICD-10-CM | POA: Diagnosis not present

## 2019-12-22 DIAGNOSIS — F0390 Unspecified dementia without behavioral disturbance: Secondary | ICD-10-CM | POA: Diagnosis not present

## 2019-12-25 DIAGNOSIS — I4891 Unspecified atrial fibrillation: Secondary | ICD-10-CM | POA: Diagnosis not present

## 2019-12-25 DIAGNOSIS — J9601 Acute respiratory failure with hypoxia: Secondary | ICD-10-CM | POA: Diagnosis not present

## 2019-12-25 DIAGNOSIS — J449 Chronic obstructive pulmonary disease, unspecified: Secondary | ICD-10-CM | POA: Diagnosis not present

## 2019-12-25 DIAGNOSIS — F0390 Unspecified dementia without behavioral disturbance: Secondary | ICD-10-CM | POA: Diagnosis not present

## 2019-12-25 DIAGNOSIS — I10 Essential (primary) hypertension: Secondary | ICD-10-CM | POA: Diagnosis not present

## 2019-12-25 DIAGNOSIS — Z803 Family history of malignant neoplasm of breast: Secondary | ICD-10-CM | POA: Diagnosis not present

## 2019-12-26 DIAGNOSIS — F0390 Unspecified dementia without behavioral disturbance: Secondary | ICD-10-CM | POA: Diagnosis not present

## 2019-12-26 DIAGNOSIS — J449 Chronic obstructive pulmonary disease, unspecified: Secondary | ICD-10-CM | POA: Diagnosis not present

## 2019-12-26 DIAGNOSIS — J9601 Acute respiratory failure with hypoxia: Secondary | ICD-10-CM | POA: Diagnosis not present

## 2019-12-26 DIAGNOSIS — Z803 Family history of malignant neoplasm of breast: Secondary | ICD-10-CM | POA: Diagnosis not present

## 2019-12-26 DIAGNOSIS — I10 Essential (primary) hypertension: Secondary | ICD-10-CM | POA: Diagnosis not present

## 2019-12-26 DIAGNOSIS — I4891 Unspecified atrial fibrillation: Secondary | ICD-10-CM | POA: Diagnosis not present

## 2019-12-27 DIAGNOSIS — J449 Chronic obstructive pulmonary disease, unspecified: Secondary | ICD-10-CM | POA: Diagnosis not present

## 2019-12-27 DIAGNOSIS — I10 Essential (primary) hypertension: Secondary | ICD-10-CM | POA: Diagnosis not present

## 2019-12-27 DIAGNOSIS — J9601 Acute respiratory failure with hypoxia: Secondary | ICD-10-CM | POA: Diagnosis not present

## 2019-12-27 DIAGNOSIS — F0390 Unspecified dementia without behavioral disturbance: Secondary | ICD-10-CM | POA: Diagnosis not present

## 2019-12-27 DIAGNOSIS — Z803 Family history of malignant neoplasm of breast: Secondary | ICD-10-CM | POA: Diagnosis not present

## 2019-12-27 DIAGNOSIS — I4891 Unspecified atrial fibrillation: Secondary | ICD-10-CM | POA: Diagnosis not present

## 2019-12-28 DIAGNOSIS — J9601 Acute respiratory failure with hypoxia: Secondary | ICD-10-CM | POA: Diagnosis not present

## 2019-12-28 DIAGNOSIS — I10 Essential (primary) hypertension: Secondary | ICD-10-CM | POA: Diagnosis not present

## 2019-12-28 DIAGNOSIS — Z803 Family history of malignant neoplasm of breast: Secondary | ICD-10-CM | POA: Diagnosis not present

## 2019-12-28 DIAGNOSIS — F0390 Unspecified dementia without behavioral disturbance: Secondary | ICD-10-CM | POA: Diagnosis not present

## 2019-12-28 DIAGNOSIS — J449 Chronic obstructive pulmonary disease, unspecified: Secondary | ICD-10-CM | POA: Diagnosis not present

## 2019-12-28 DIAGNOSIS — I4891 Unspecified atrial fibrillation: Secondary | ICD-10-CM | POA: Diagnosis not present

## 2019-12-30 DIAGNOSIS — J9601 Acute respiratory failure with hypoxia: Secondary | ICD-10-CM | POA: Diagnosis not present

## 2019-12-30 DIAGNOSIS — I10 Essential (primary) hypertension: Secondary | ICD-10-CM | POA: Diagnosis not present

## 2019-12-30 DIAGNOSIS — Z803 Family history of malignant neoplasm of breast: Secondary | ICD-10-CM | POA: Diagnosis not present

## 2019-12-30 DIAGNOSIS — I4891 Unspecified atrial fibrillation: Secondary | ICD-10-CM | POA: Diagnosis not present

## 2019-12-30 DIAGNOSIS — J449 Chronic obstructive pulmonary disease, unspecified: Secondary | ICD-10-CM | POA: Diagnosis not present

## 2019-12-30 DIAGNOSIS — F0390 Unspecified dementia without behavioral disturbance: Secondary | ICD-10-CM | POA: Diagnosis not present

## 2019-12-31 DIAGNOSIS — J449 Chronic obstructive pulmonary disease, unspecified: Secondary | ICD-10-CM | POA: Diagnosis not present

## 2019-12-31 DIAGNOSIS — J9601 Acute respiratory failure with hypoxia: Secondary | ICD-10-CM | POA: Diagnosis not present

## 2019-12-31 DIAGNOSIS — I4891 Unspecified atrial fibrillation: Secondary | ICD-10-CM | POA: Diagnosis not present

## 2019-12-31 DIAGNOSIS — Z803 Family history of malignant neoplasm of breast: Secondary | ICD-10-CM | POA: Diagnosis not present

## 2019-12-31 DIAGNOSIS — F0390 Unspecified dementia without behavioral disturbance: Secondary | ICD-10-CM | POA: Diagnosis not present

## 2019-12-31 DIAGNOSIS — I10 Essential (primary) hypertension: Secondary | ICD-10-CM | POA: Diagnosis not present

## 2020-01-02 DIAGNOSIS — I4891 Unspecified atrial fibrillation: Secondary | ICD-10-CM | POA: Diagnosis not present

## 2020-01-02 DIAGNOSIS — J9601 Acute respiratory failure with hypoxia: Secondary | ICD-10-CM | POA: Diagnosis not present

## 2020-01-02 DIAGNOSIS — I10 Essential (primary) hypertension: Secondary | ICD-10-CM | POA: Diagnosis not present

## 2020-01-02 DIAGNOSIS — J449 Chronic obstructive pulmonary disease, unspecified: Secondary | ICD-10-CM | POA: Diagnosis not present

## 2020-01-02 DIAGNOSIS — F0390 Unspecified dementia without behavioral disturbance: Secondary | ICD-10-CM | POA: Diagnosis not present

## 2020-01-02 DIAGNOSIS — Z803 Family history of malignant neoplasm of breast: Secondary | ICD-10-CM | POA: Diagnosis not present

## 2020-01-03 DIAGNOSIS — Z803 Family history of malignant neoplasm of breast: Secondary | ICD-10-CM | POA: Diagnosis not present

## 2020-01-03 DIAGNOSIS — I10 Essential (primary) hypertension: Secondary | ICD-10-CM | POA: Diagnosis not present

## 2020-01-03 DIAGNOSIS — J449 Chronic obstructive pulmonary disease, unspecified: Secondary | ICD-10-CM | POA: Diagnosis not present

## 2020-01-03 DIAGNOSIS — I4891 Unspecified atrial fibrillation: Secondary | ICD-10-CM | POA: Diagnosis not present

## 2020-01-03 DIAGNOSIS — J9601 Acute respiratory failure with hypoxia: Secondary | ICD-10-CM | POA: Diagnosis not present

## 2020-01-03 DIAGNOSIS — F0390 Unspecified dementia without behavioral disturbance: Secondary | ICD-10-CM | POA: Diagnosis not present

## 2020-01-04 DIAGNOSIS — F0390 Unspecified dementia without behavioral disturbance: Secondary | ICD-10-CM | POA: Diagnosis not present

## 2020-01-04 DIAGNOSIS — Z803 Family history of malignant neoplasm of breast: Secondary | ICD-10-CM | POA: Diagnosis not present

## 2020-01-04 DIAGNOSIS — I10 Essential (primary) hypertension: Secondary | ICD-10-CM | POA: Diagnosis not present

## 2020-01-04 DIAGNOSIS — I4891 Unspecified atrial fibrillation: Secondary | ICD-10-CM | POA: Diagnosis not present

## 2020-01-04 DIAGNOSIS — J449 Chronic obstructive pulmonary disease, unspecified: Secondary | ICD-10-CM | POA: Diagnosis not present

## 2020-01-04 DIAGNOSIS — J9601 Acute respiratory failure with hypoxia: Secondary | ICD-10-CM | POA: Diagnosis not present

## 2020-01-05 DIAGNOSIS — I4891 Unspecified atrial fibrillation: Secondary | ICD-10-CM | POA: Diagnosis not present

## 2020-01-05 DIAGNOSIS — J9601 Acute respiratory failure with hypoxia: Secondary | ICD-10-CM | POA: Diagnosis not present

## 2020-01-05 DIAGNOSIS — F0390 Unspecified dementia without behavioral disturbance: Secondary | ICD-10-CM | POA: Diagnosis not present

## 2020-01-05 DIAGNOSIS — J449 Chronic obstructive pulmonary disease, unspecified: Secondary | ICD-10-CM | POA: Diagnosis not present

## 2020-01-05 DIAGNOSIS — I10 Essential (primary) hypertension: Secondary | ICD-10-CM | POA: Diagnosis not present

## 2020-01-05 DIAGNOSIS — Z803 Family history of malignant neoplasm of breast: Secondary | ICD-10-CM | POA: Diagnosis not present

## 2020-01-06 DIAGNOSIS — I4891 Unspecified atrial fibrillation: Secondary | ICD-10-CM | POA: Diagnosis not present

## 2020-01-06 DIAGNOSIS — J9601 Acute respiratory failure with hypoxia: Secondary | ICD-10-CM | POA: Diagnosis not present

## 2020-01-06 DIAGNOSIS — I10 Essential (primary) hypertension: Secondary | ICD-10-CM | POA: Diagnosis not present

## 2020-01-06 DIAGNOSIS — J449 Chronic obstructive pulmonary disease, unspecified: Secondary | ICD-10-CM | POA: Diagnosis not present

## 2020-01-06 DIAGNOSIS — Z803 Family history of malignant neoplasm of breast: Secondary | ICD-10-CM | POA: Diagnosis not present

## 2020-01-06 DIAGNOSIS — F0390 Unspecified dementia without behavioral disturbance: Secondary | ICD-10-CM | POA: Diagnosis not present

## 2020-01-07 DIAGNOSIS — J449 Chronic obstructive pulmonary disease, unspecified: Secondary | ICD-10-CM | POA: Diagnosis not present

## 2020-01-07 DIAGNOSIS — J9601 Acute respiratory failure with hypoxia: Secondary | ICD-10-CM | POA: Diagnosis not present

## 2020-01-07 DIAGNOSIS — I4891 Unspecified atrial fibrillation: Secondary | ICD-10-CM | POA: Diagnosis not present

## 2020-01-07 DIAGNOSIS — Z803 Family history of malignant neoplasm of breast: Secondary | ICD-10-CM | POA: Diagnosis not present

## 2020-01-07 DIAGNOSIS — I10 Essential (primary) hypertension: Secondary | ICD-10-CM | POA: Diagnosis not present

## 2020-01-07 DIAGNOSIS — F0390 Unspecified dementia without behavioral disturbance: Secondary | ICD-10-CM | POA: Diagnosis not present

## 2020-01-18 DEATH — deceased

## 2020-04-03 IMAGING — DX DG CHEST 2V
2 series · 2 of 2 positions shown · non-contrast
Comparison: Chest x-ray dated November 26, 2015.

CLINICAL DATA: Nonproductive cough and shortness of breath since
yesterday.

EXAM:
CHEST - 2 VIEW

[chest pa]
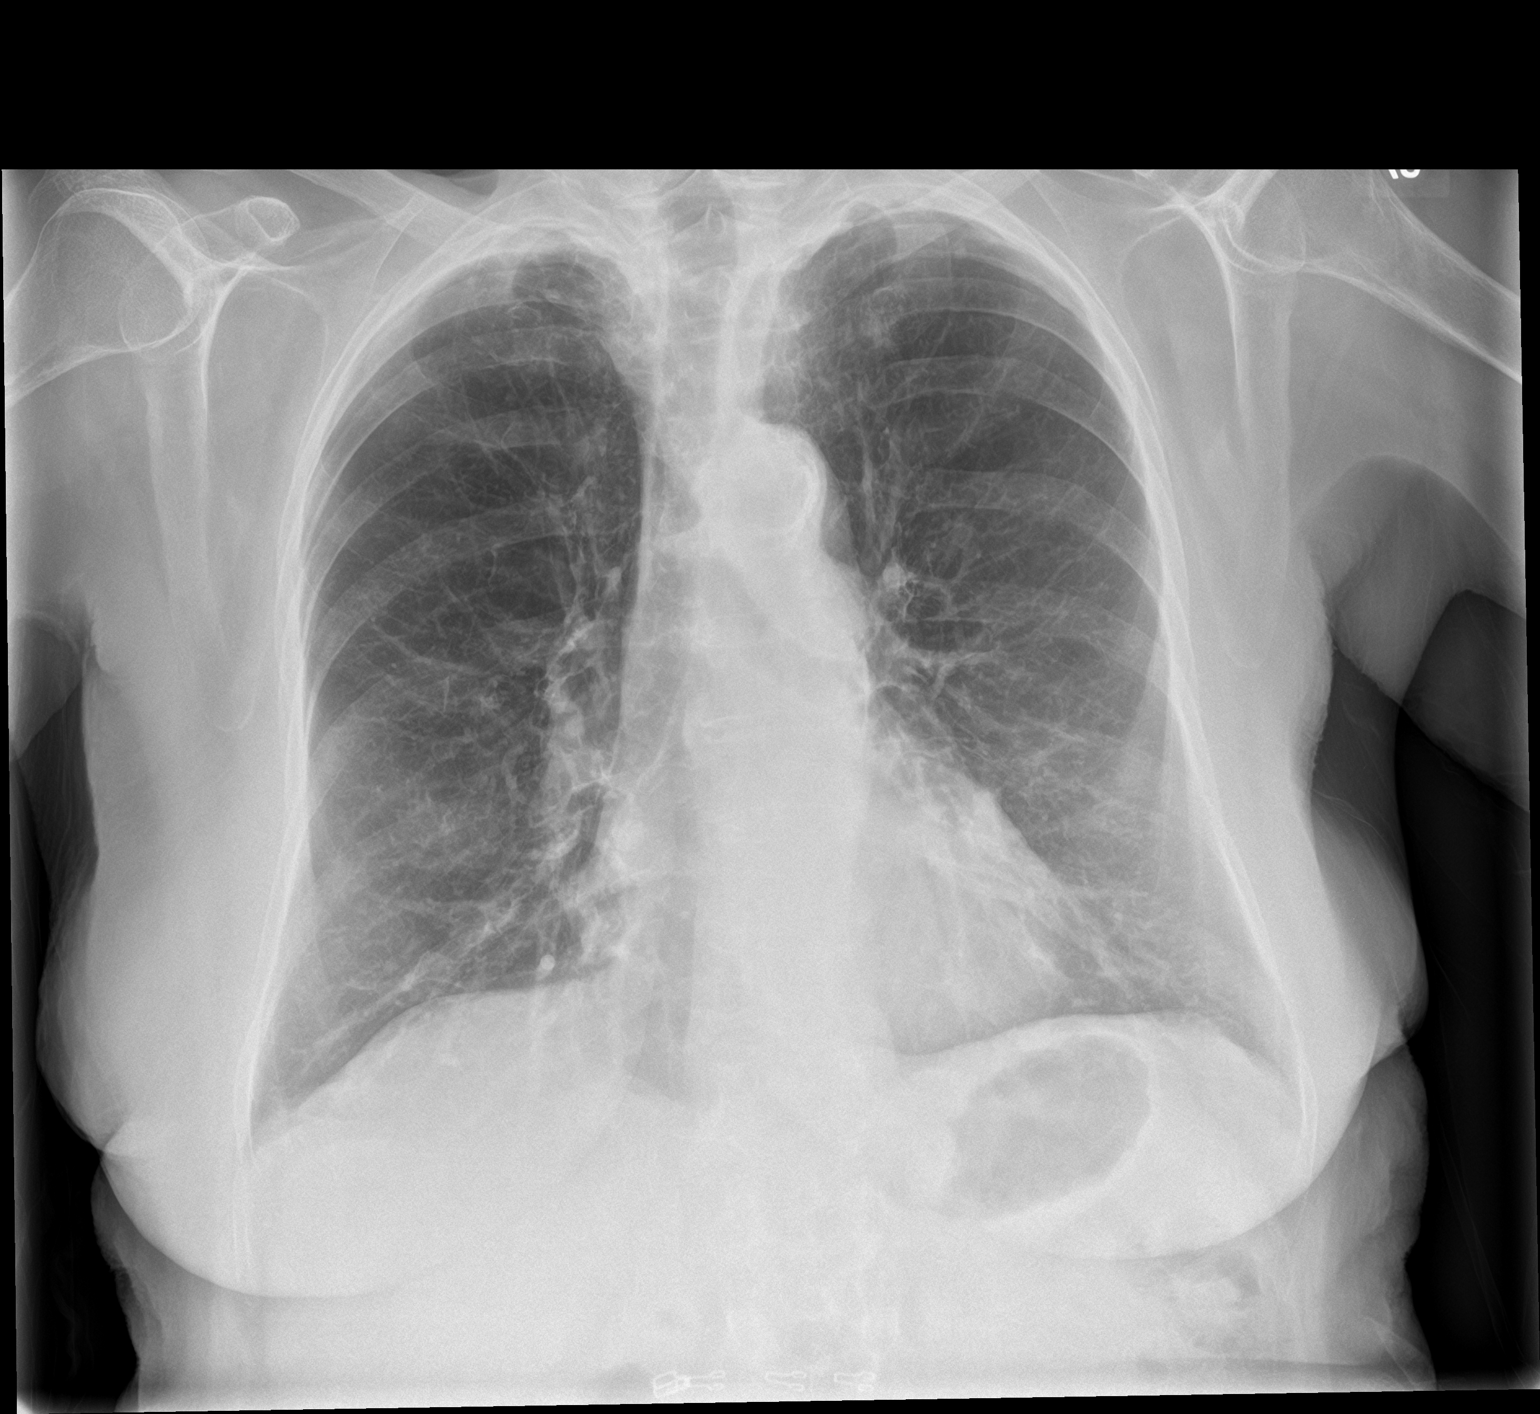

[chest lat]
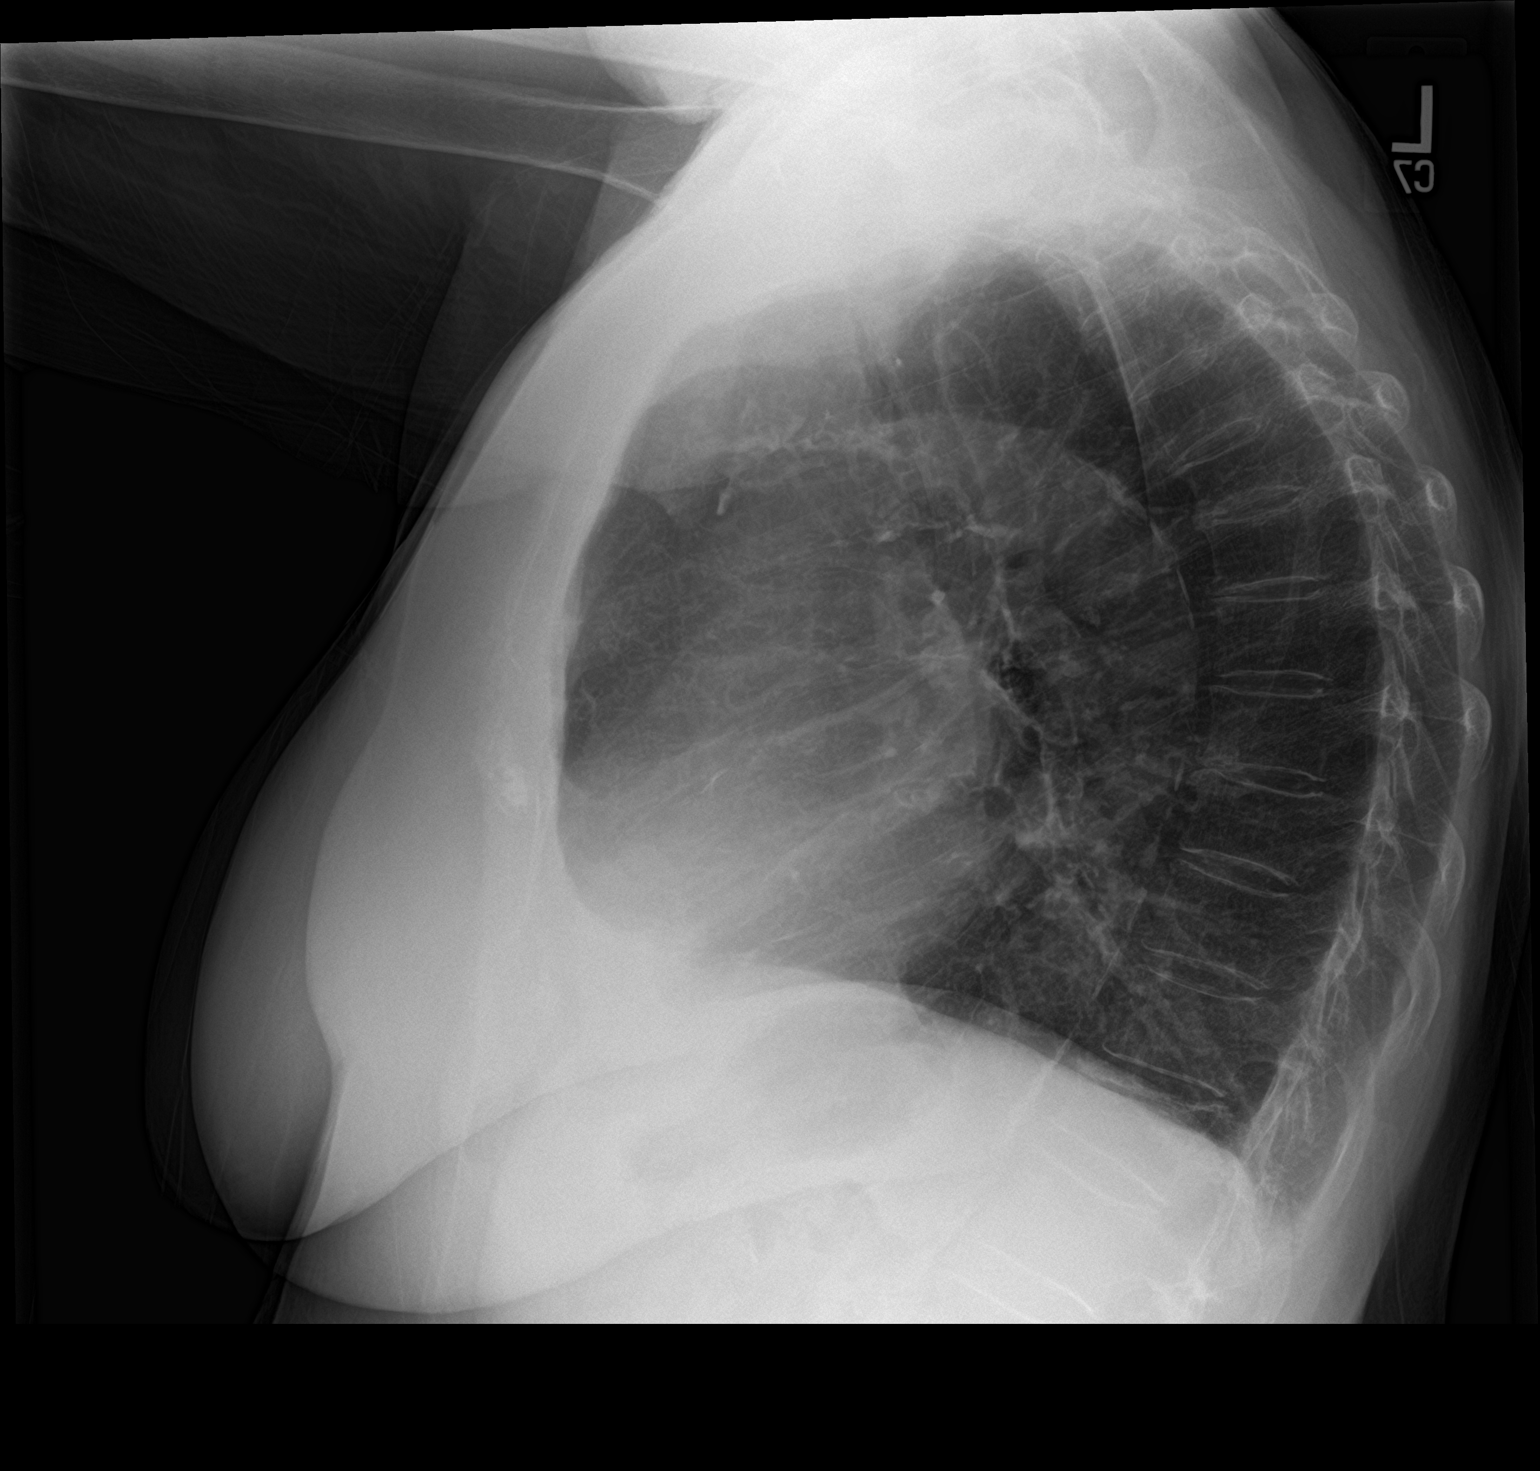

[2 of 2 positions shown; findings below may reference images not displayed]

FINDINGS: The heart size and mediastinal contours are within normal limits.
Atherosclerotic calcification of the aortic arch. Normal pulmonary
vascularity. The lungs remain hyperinflated with chronic biapical
pleuroparenchymal scarring. No focal consolidation, pleural
effusion, or pneumothorax. New mild age-indeterminate compression
deformity at the thoracolumbar junction.
IMPRESSION: 1.  No active cardiopulmonary disease.  COPD.
2. New mild age-indeterminate compression deformity at the
thoracolumbar junction. Correlate with point tenderness.
3.  Aortic atherosclerosis ([id number]-DX2.2).

## 2020-04-03 IMAGING — CT CT HEAD W/O CM
4 series · 17 of 47 positions shown, 19 images · non-contrast
Comparison: None.

CLINICAL DATA: 84-year-old female with giddiness and altered mental
status today. Initial encounter.

EXAM:
CT HEAD WITHOUT CONTRAST
TECHNIQUE: Contiguous axial images were obtained from the base of the skull
through the vertex without intravenous contrast.

[Series 2: head trauma wo · axial · 0.42mm/px · z∈[+1399,+1514]mm · 7 of 31 slices shown, 9 images]
[im 4/31  brain]
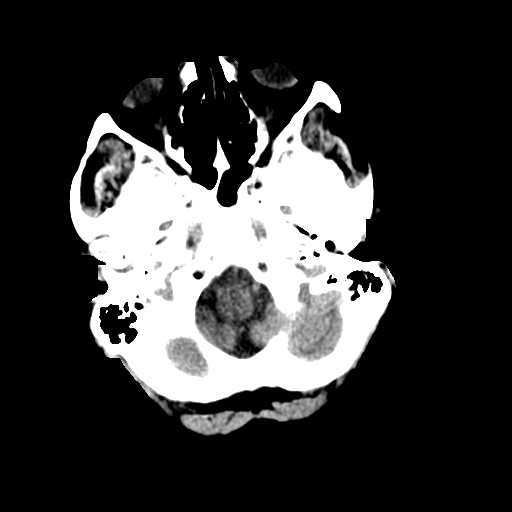
[im 4/31  bone]
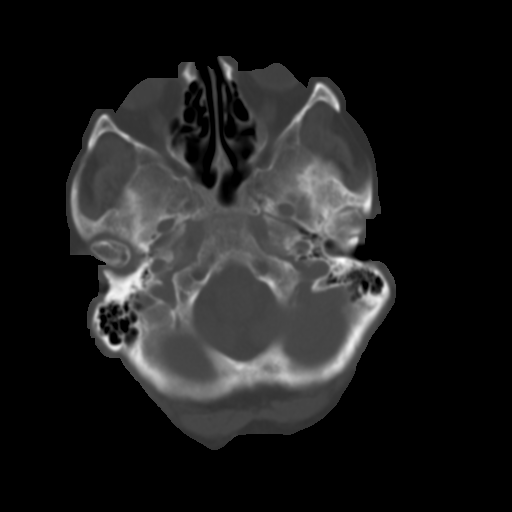
[im 8/31  brain]
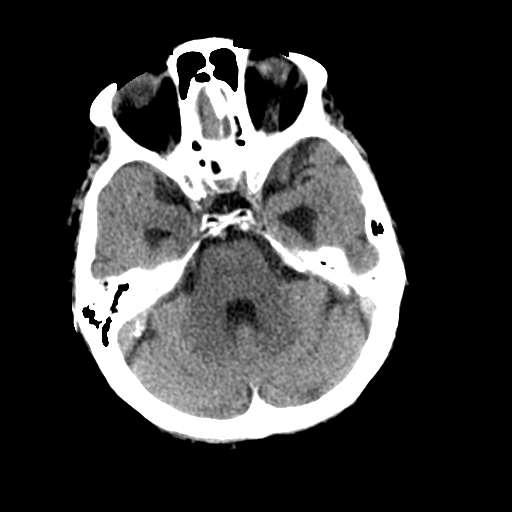
[im 12/31  brain]
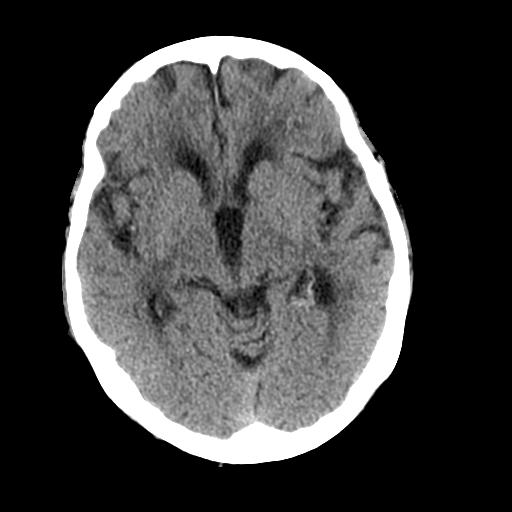
[im 16/31  brain]
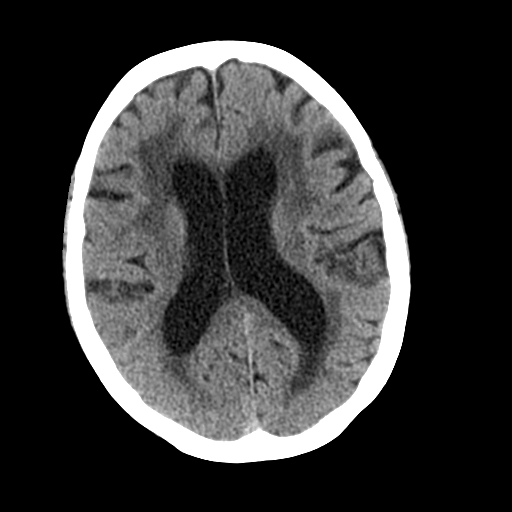
[im 19/31  brain]
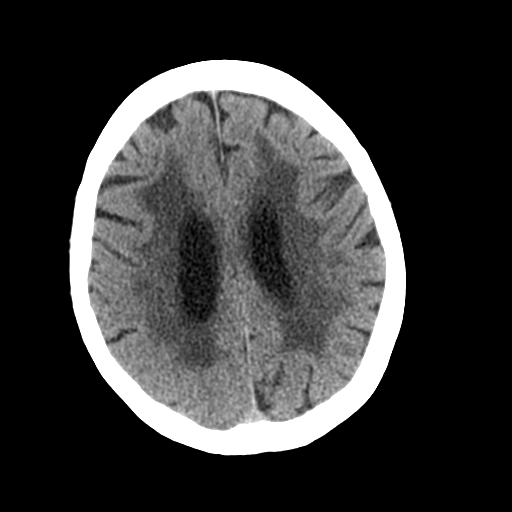
[im 19/31  bone]
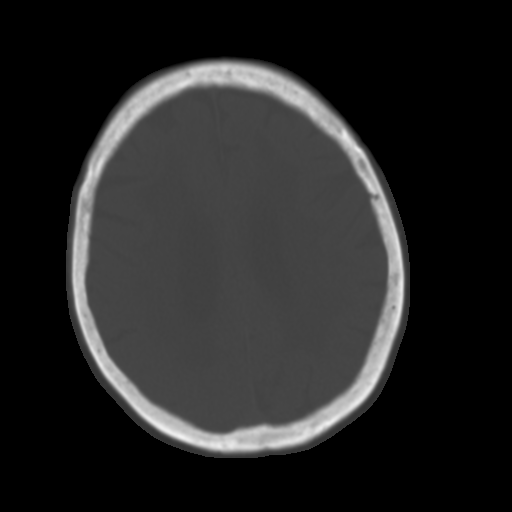
[im 23/31  brain]
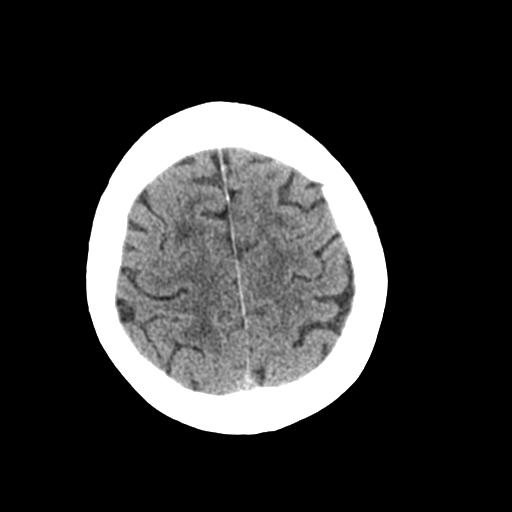
[im 27/31  brain]
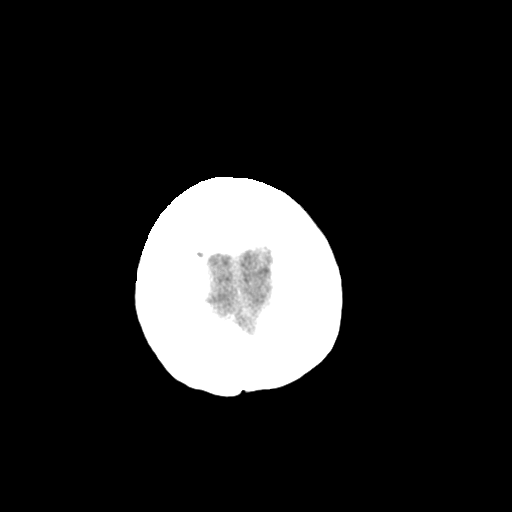

[Series 3: head bone · axial · 0.42mm/px · z∈[+1398,+1452]mm · 4 of 77 slices shown]
[im 8/77  bone]
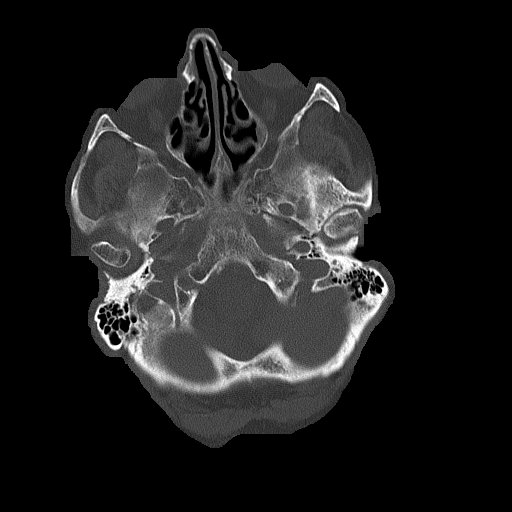
[im 16/77  bone]
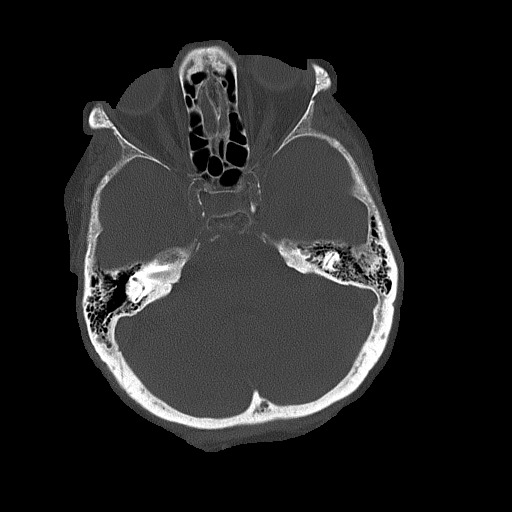
[im 23/77  bone]
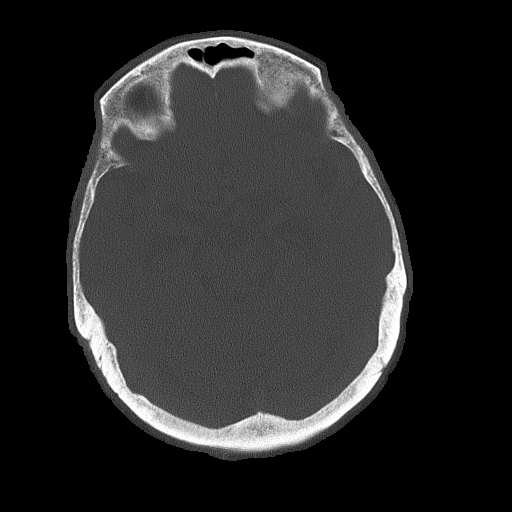
[im 35/77  bone]
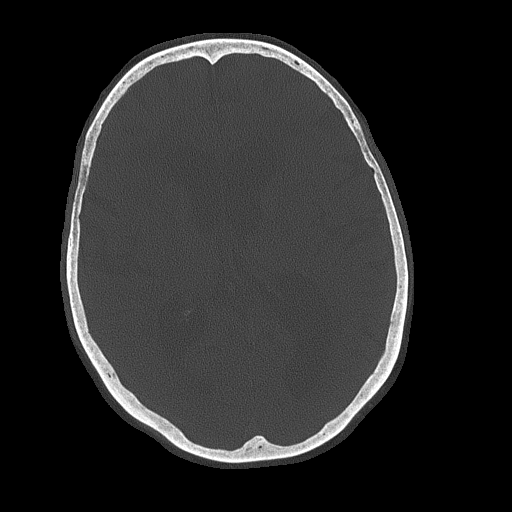

[Series 4: coronal soft tissue · coronal · 0.31mm/px · 3 of 72 slices shown]
[im 24/72  brain]
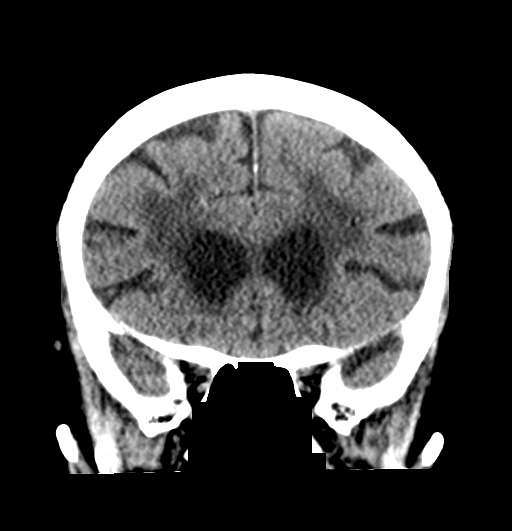
[im 32/72  brain]
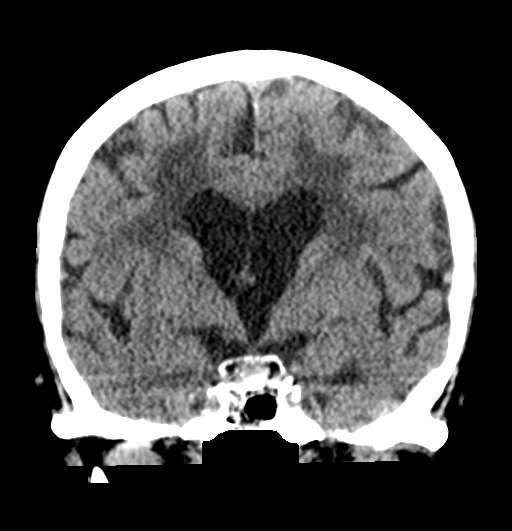
[im 40/72  brain]
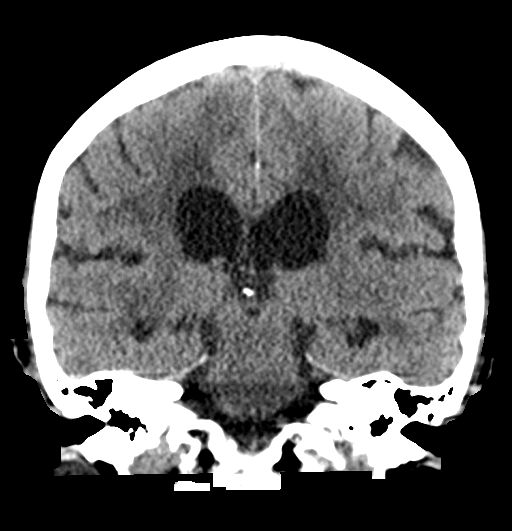

[Series 5: sagittal soft tissue · sagittal · 0.33mm/px · 3 of 56 slices shown]
[im 19/56  brain]
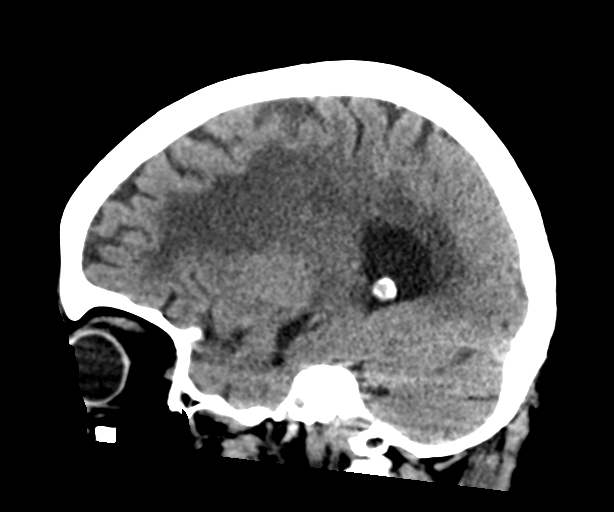
[im 28/56  brain]
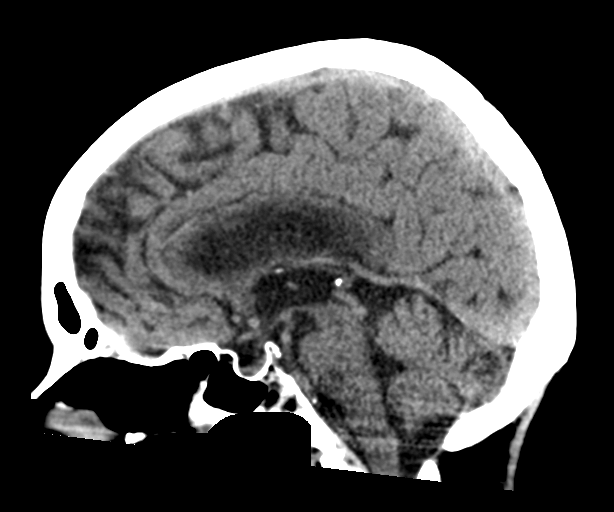
[im 37/56  brain]
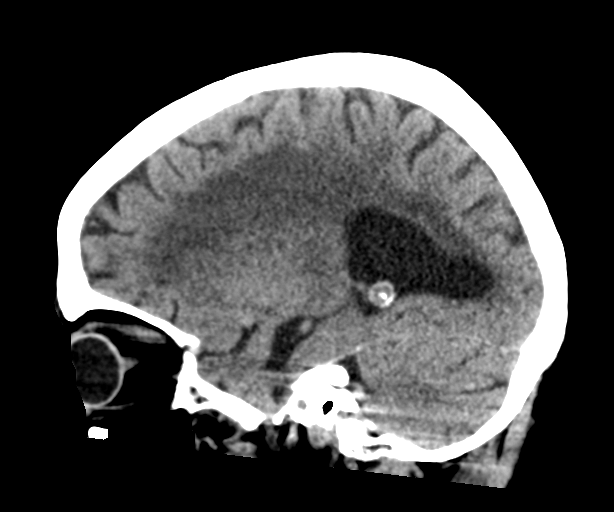

[17 of 47 positions shown; findings below may reference images not displayed]

FINDINGS: Brain: No intracranial hemorrhage or CT evidence of large acute
infarct. Prominent chronic microvascular changes. Global atrophy. No
intracranial mass lesion noted on this unenhanced exam.

Vascular: Vascular calcifications.  No acute hyperdense vessel.

Skull: No acute abnormality.

Sinuses/Orbits: No acute orbital abnormality. Visualized paranasal
sinuses are clear.

Other: Visualized mastoid air cells and middle ear cavities are
clear.
IMPRESSION: 1. No intracranial hemorrhage or CT evidence of large acute infarct.
2. Prominent chronic microvascular changes.
3. Global atrophy.
